# Patient Record
Sex: Male | Born: 1990 | Race: Black or African American | Hispanic: No | Marital: Single | Smoking: Former smoker
Health system: Southern US, Community
[De-identification: ages and names within clinical notes are randomized; demographics above are authoritative.]

## PROBLEM LIST (undated history)

## (undated) DIAGNOSIS — E119 Type 2 diabetes mellitus without complications: Secondary | ICD-10-CM

## (undated) DIAGNOSIS — I10 Essential (primary) hypertension: Secondary | ICD-10-CM

---
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
## 2016-06-23 ENCOUNTER — Emergency Department (HOSPITAL_COMMUNITY): Payer: Managed Care, Other (non HMO)

## 2016-06-23 ENCOUNTER — Emergency Department (HOSPITAL_COMMUNITY)
Admission: EM | Admit: 2016-06-23 | Discharge: 2016-06-23 | Disposition: A | Discharge status: Home / Self Care | Payer: Managed Care, Other (non HMO) | Attending: Emergency Medicine | Admitting: Emergency Medicine

## 2016-06-23 ENCOUNTER — Encounter (HOSPITAL_COMMUNITY): Payer: Self-pay | Admitting: Emergency Medicine

## 2016-06-23 DIAGNOSIS — Z87891 Personal history of nicotine dependence: Secondary | ICD-10-CM | POA: Diagnosis not present

## 2016-06-23 DIAGNOSIS — R51 Headache: Secondary | ICD-10-CM | POA: Diagnosis not present

## 2016-06-23 DIAGNOSIS — R200 Anesthesia of skin: Secondary | ICD-10-CM

## 2016-06-23 DIAGNOSIS — F410 Panic disorder [episodic paroxysmal anxiety] without agoraphobia: Secondary | ICD-10-CM | POA: Insufficient documentation

## 2016-06-23 DIAGNOSIS — R791 Abnormal coagulation profile: Secondary | ICD-10-CM | POA: Diagnosis not present

## 2016-06-23 DIAGNOSIS — R202 Paresthesia of skin: Secondary | ICD-10-CM

## 2016-06-23 DIAGNOSIS — R002 Palpitations: Secondary | ICD-10-CM | POA: Diagnosis present

## 2016-06-23 LAB — CBC
HEMATOCRIT: 40.8 % (ref 39.0–52.0)
Hemoglobin: 13.8 g/dL (ref 13.0–17.0)
MCH: 32.9 pg (ref 26.0–34.0)
MCHC: 33.8 g/dL (ref 30.0–36.0)
MCV: 97.4 fL (ref 78.0–100.0)
PLATELETS: 290 10*3/uL (ref 150–400)
RBC: 4.19 MIL/uL — ABNORMAL LOW (ref 4.22–5.81)
RDW: 12.3 % (ref 11.5–15.5)
WBC: 11.1 10*3/uL — ABNORMAL HIGH (ref 4.0–10.5)

## 2016-06-23 LAB — COMPREHENSIVE METABOLIC PANEL
ALT: 35 U/L (ref 17–63)
AST: 25 U/L (ref 15–41)
Albumin: 4.3 g/dL (ref 3.5–5.0)
Alkaline Phosphatase: 58 U/L (ref 38–126)
Anion gap: 6 (ref 5–15)
BUN: 13 mg/dL (ref 6–20)
CHLORIDE: 106 mmol/L (ref 101–111)
CO2: 27 mmol/L (ref 22–32)
Calcium: 9.4 mg/dL (ref 8.9–10.3)
Creatinine, Ser: 0.82 mg/dL (ref 0.61–1.24)
Glucose, Bld: 101 mg/dL — ABNORMAL HIGH (ref 65–99)
POTASSIUM: 3.4 mmol/L — AB (ref 3.5–5.1)
SODIUM: 139 mmol/L (ref 135–145)
Total Bilirubin: 0.5 mg/dL (ref 0.3–1.2)
Total Protein: 7.8 g/dL (ref 6.5–8.1)

## 2016-06-23 LAB — I-STAT CHEM 8, ED
BUN: 13 mg/dL (ref 6–20)
CALCIUM ION: 1.15 mmol/L (ref 1.15–1.40)
Chloride: 105 mmol/L (ref 101–111)
Creatinine, Ser: 0.8 mg/dL (ref 0.61–1.24)
Glucose, Bld: 96 mg/dL (ref 65–99)
HCT: 42 % (ref 39.0–52.0)
HEMOGLOBIN: 14.3 g/dL (ref 13.0–17.0)
Potassium: 3.4 mmol/L — ABNORMAL LOW (ref 3.5–5.1)
SODIUM: 139 mmol/L (ref 135–145)
TCO2: 29 mmol/L (ref 0–100)

## 2016-06-23 LAB — DIFFERENTIAL
BASOS ABS: 0 10*3/uL (ref 0.0–0.1)
BASOS PCT: 0 %
EOS ABS: 0.1 10*3/uL (ref 0.0–0.7)
Eosinophils Relative: 1 %
Lymphocytes Relative: 22 %
Lymphs Abs: 2.4 10*3/uL (ref 0.7–4.0)
MONO ABS: 1.5 10*3/uL — AB (ref 0.1–1.0)
MONOS PCT: 14 %
Neutro Abs: 7 10*3/uL (ref 1.7–7.7)
Neutrophils Relative %: 63 %

## 2016-06-23 LAB — CBG MONITORING, ED: Glucose-Capillary: 91 mg/dL (ref 65–99)

## 2016-06-23 LAB — I-STAT TROPONIN, ED: TROPONIN I, POC: 0 ng/mL (ref 0.00–0.08)

## 2016-06-23 LAB — APTT: APTT: 35 s (ref 24–36)

## 2016-06-23 LAB — PROTIME-INR
INR: 0.94
PROTHROMBIN TIME: 12.5 s (ref 11.4–15.2)

## 2016-06-23 MED ORDER — THIAMINE HCL 100 MG/ML IJ SOLN
100.0000 mg | Freq: Once | INTRAMUSCULAR | Status: DC
Start: 2016-06-23 — End: 2016-06-23

## 2016-06-23 NOTE — ED Provider Notes (Signed)
[facility] DEPT Provider Note   CSN: [id number] Arrival date & time: 06/23/16  1258     History   Chief Complaint Chief Complaint  Patient presents with  . Dizziness  . Numbness    HPI [facility] Greer is a 26 y.o. male.  He presents for evaluation of a constellation of symptoms including facial tingling, palpitations, headache and neck pain, all of which started while playing a video game today.  Symptoms mostly resolved, with mild residual left face numbness, mild headache, and mild soreness in his neck.  Recalls having a similar episode about 1 week ago which lasted 15 minutes also while relaxing.  He recalls the headache as being present upon awakening this morning he denies prior history of migraine headaches, recent trauma to head, fever, chills, nausea or vomiting.  He came here by private vehicle.  There are no other known modifying factors.  HPI  History reviewed. No pertinent past medical history.  There are no active problems to display for this patient.   History reviewed. No pertinent surgical history.     Home Medications    Prior to Admission medications   Not on File    Family History No family history on file.  Social History Social History  Substance Use Topics  . Smoking status: Former Games developer  . Smokeless tobacco: Never Used  . Alcohol use No     Allergies   Patient has no known allergies.   Review of Systems Review of Systems  All other systems reviewed and are negative.    Physical Exam Updated Vital Signs BP 139/77 (BP Location: Left Arm)   Pulse 93   Temp 98.4 F (36.9 C) (Oral)   Resp (!) 25   Ht  (1.753 m)   Wt 280 lb (127 kg)   SpO2 97%   BMI 41.35 kg/m   Physical Exam  Constitutional: He is oriented to person, place, and time. He appears well-developed.  Overweight.  HENT:  Head: Normocephalic and atraumatic.  Right Ear: External ear normal.  Left Ear: External ear normal.  Eyes: Conjunctivae and EOM are  normal. Pupils are equal, round, and reactive to light.  Neck: Normal range of motion and phonation normal. Neck supple.  No meningismus.  Cardiovascular: Normal rate, regular rhythm and normal heart sounds.   Pulmonary/Chest: Effort normal and breath sounds normal. He exhibits no bony tenderness.  Abdominal: Soft. There is no tenderness.  Musculoskeletal: Normal range of motion.  Neurological: He is alert and oriented to person, place, and time. No sensory deficit. He exhibits normal muscle tone. Coordination normal.  No dysarthria or aphasia or nystagmus.  Very minimal light touch sensation decreased left mid face.  No facial asymmetry.  No ataxia.  No pronator drift.  Skin: Skin is warm, dry and intact.  Psychiatric: He has a normal mood and affect. His behavior is normal. Judgment and thought content normal.  Nursing note and vitals reviewed.    ED Treatments / Results  Labs (all labs ordered are listed, but only abnormal results are displayed) Labs Reviewed  CBC - Abnormal; Notable for the following:       Result Value   WBC 11.1 (*)    RBC 4.19 (*)    All other components within normal limits  DIFFERENTIAL - Abnormal; Notable for the following:    Monocytes Absolute 1.5 (*)    All other components within normal limits  COMPREHENSIVE METABOLIC PANEL - Abnormal; Notable for the following:  Potassium 3.4 (*)    Glucose, Bld 101 (*)    All other components within normal limits  I-STAT CHEM 8, ED - Abnormal; Notable for the following:    Potassium 3.4 (*)    All other components within normal limits  PROTIME-INR  APTT  I-STAT TROPOININ, ED  CBG MONITORING, ED    EKG  EKG Interpretation  Date/Time:  Tuesday June 23 2016 13:17:06 EDT Ventricular Rate:  99 PR Interval:    QRS Duration: 89 QT Interval:  335 QTC Calculation: 430 R Axis:   18 Text Interpretation:  Sinus rhythm Borderline T abnormalities, inferior leads agree. no old comparison Confirmed by Donnald Garre,  MD, Lebron Conners [phone]) on 06/23/2016 1:26:35 PM       Radiology Ct Head Wo Contrast  Result Date: 06/23/2016 CLINICAL DATA:  26 year old male with pressure occipital region. Tingling left face. Lightheadedness. Similar symptoms 2 weeks ago. Initial encounter. EXAM: CT HEAD WITHOUT CONTRAST TECHNIQUE: Contiguous axial images were obtained from the base of the skull through the vertex without intravenous contrast. COMPARISON:  04/26/2009 head CT. FINDINGS: Brain: No intracranial hemorrhage or CT evidence of large acute infarct. No intracranial mass lesion noted on this unenhanced exam. Vascular: No hyperdense vessel. Skull: Negative. Sinuses/Orbits: No acute orbital abnormality. Mild mucosal thickening right maxillary sinus with polypoid opacification left maxillary sinus. Other: Negative IMPRESSION: No acute intracranial abnormality. Mild mucosal thickening right maxillary sinus and polypoid opacification left maxillary sinus. Electronically Signed   By: Lacy Duverney M.D.   On: 06/23/2016 14:23    Procedures Procedures (including critical care time)  Medications Ordered in ED Medications - No data to display   Initial Impression / Assessment and Plan / ED Course  I have reviewed the triage vital signs and the nursing notes.  Pertinent labs & imaging results that were available during my care of the patient were reviewed by me and considered in my medical decision making (see chart for details).     Medications - No data to display  Patient Vitals for the past 24 hrs:  BP Temp Temp src Pulse Resp SpO2 Height Weight  06/23/16 1418 139/77 98.4 F (36.9 C) Oral 93 (!) 25 97 % - -  06/23/16 1308 (!) 149/97 98.7 F (37.1 C) Oral (!) 102 17 97 %  (1.753 m) 280 lb (127 kg)    5:12 PM Reevaluation with update and discussion. After initial assessment and treatment, an updated evaluation reveals he states that the head discomfort and facial tingling have entirely resolved spontaneously.  He  was unable to tolerate MRI, because it was "freaky."  Findings discussed with the patient and all questions answered. Nieshia [facility] Greer    Final Clinical Impressions(s) / ED Diagnoses   Final diagnoses:  Panic attack    Nonspecific symptoms, unlikely to represent stroke.  Patient was unable to tolerate MRI imaging.  Symptoms are most consistent with panic attack, which has occurred twice.  Doubt CVA, TIA, ACS, PE or pneumonia.  Nursing Notes Reviewed/ Care Coordinated Applicable Imaging Reviewed Interpretation of Laboratory Data incorporated into ED treatment  The patient appears reasonably screened and/or stabilized for discharge and I doubt any other medical condition or other [facility] requiring further screening, evaluation, or treatment in the ED at this time prior to discharge.  Plan: Home Medications-OTC, as needed; Home Treatments-rest, fluids; return here if the recommended treatment, does not improve the symptoms; Recommended follow up-PCP and neurology follow-up 1-2 weeks.   New Prescriptions New Prescriptions   No  medications on file     Mancel Bale, MD 06/23/16 1720

## 2016-06-23 NOTE — ED Triage Notes (Signed)
Patient states that he was playing a video game when he got lightheaded, had numbness in left side of face. Patient states that same symptoms happened couple weeks ago but he ignored it.

## 2016-06-23 NOTE — ED Notes (Signed)
Pt returned from MRI °

## 2016-06-23 NOTE — ED Notes (Signed)
Patient was alert, oriented and stable upon discharge. RN went over AVS and patient had no further questions.  

## 2016-06-23 NOTE — Discharge Instructions (Signed)
Get plenty of rest, drink plenty of fluids and try to eat 3 good meals each day.  Follow-up with a primary care doctor for checkup in 1-2 weeks.  Use the phone number attached to help you find a doctor

## 2016-06-23 NOTE — ED Notes (Signed)
Pt transported to MRI 

## 2016-06-23 NOTE — ED Notes (Signed)
MRI informed RN that pt did not tolerate MRI and refused to try again even with sedation meds. MD made aware.

## 2016-06-27 ENCOUNTER — Emergency Department (HOSPITAL_COMMUNITY)
Admission: EM | Admit: 2016-06-27 | Discharge: 2016-06-27 | Disposition: A | Discharge status: Home / Self Care | Payer: Managed Care, Other (non HMO) | Attending: Dermatology | Admitting: Dermatology

## 2016-06-27 ENCOUNTER — Encounter (HOSPITAL_COMMUNITY): Payer: Self-pay

## 2016-06-27 DIAGNOSIS — Z5321 Procedure and treatment not carried out due to patient leaving prior to being seen by health care provider: Secondary | ICD-10-CM | POA: Diagnosis not present

## 2016-06-27 DIAGNOSIS — F419 Anxiety disorder, unspecified: Secondary | ICD-10-CM | POA: Insufficient documentation

## 2016-06-27 NOTE — ED Triage Notes (Signed)
He states he was seen here a few days ago and was dx with anxiety/panic attack. He states he continues to have similar sx "whenever I think about too many things I get short of breath". He is in no distress.

## 2016-06-27 NOTE — ED Notes (Signed)
Bed: WA19 Expected date:  Expected time:  Means of arrival:  Comments: 

## 2016-06-27 NOTE — ED Notes (Signed)
Pt called for triage no response ?

## 2018-04-05 IMAGING — CT CT HEAD W/O CM
3 of 4 series · 15 of 47 positions shown, 18 images · non-contrast
Comparison: 04/26/2009 head CT.

CLINICAL DATA: 25-year-old male with pressure occipital region.
Tingling left face. Lightheadedness. Similar symptoms 2 weeks ago.
Initial encounter.

EXAM:
CT HEAD WITHOUT CONTRAST
TECHNIQUE: Contiguous axial images were obtained from the base of the skull
through the vertex without intravenous contrast.

[Series 2: head w/o · axial · non-contrast · 0.45mm/px · z∈[-147,-17]mm · 9 of 32 slices shown, 12 images]
[im 3/32  brain]
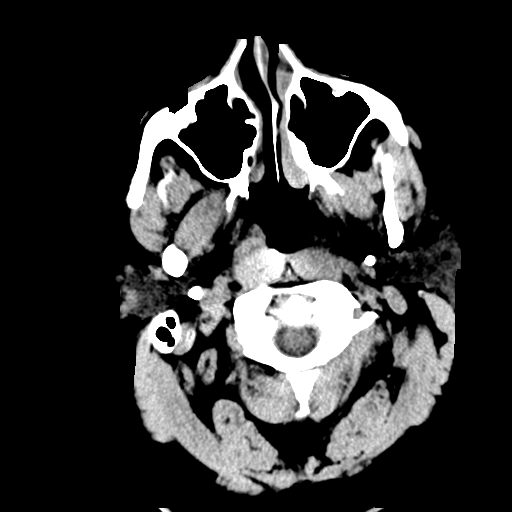
[im 3/32  bone]
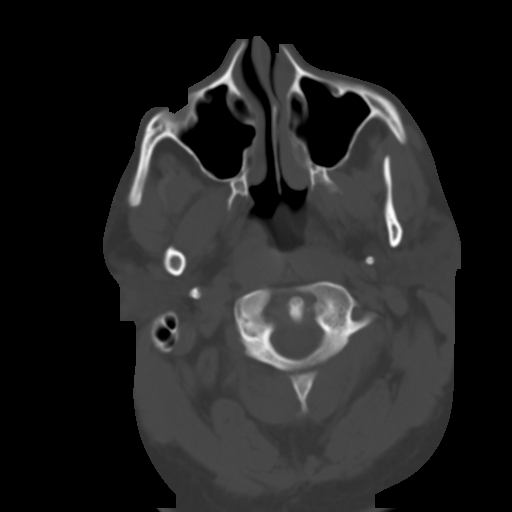
[im 7/32  brain]
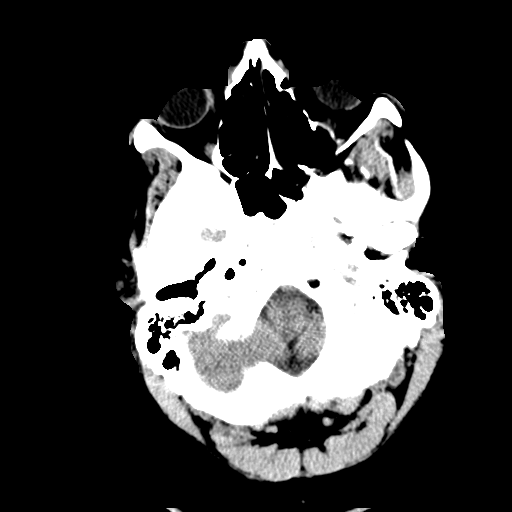
[im 9/32  brain]
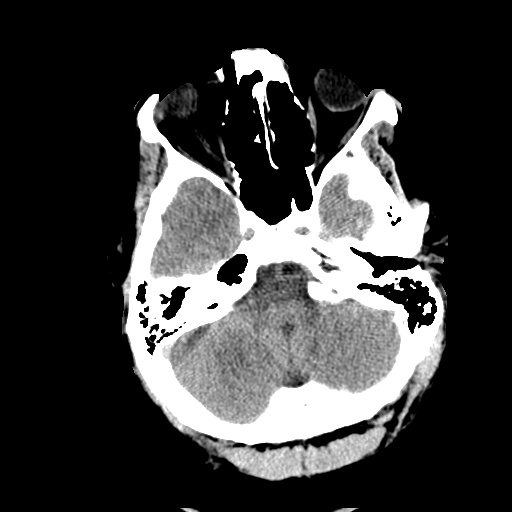
[im 14/32  brain]
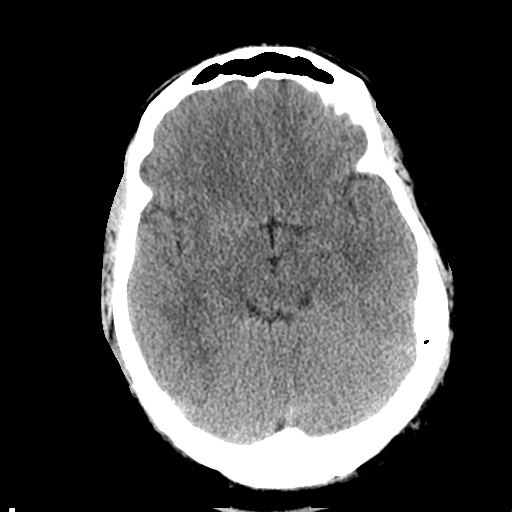
[im 16/32  brain]
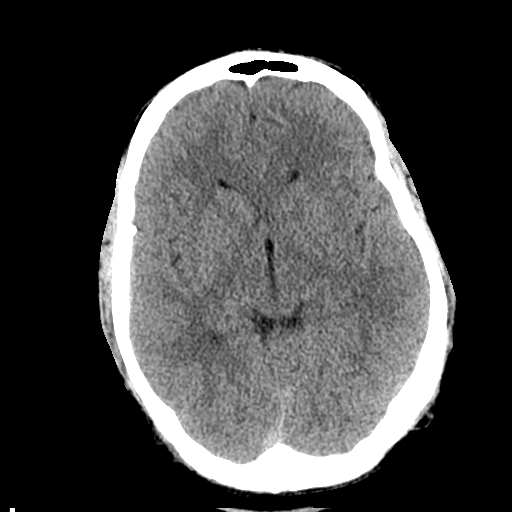
[im 16/32  bone]
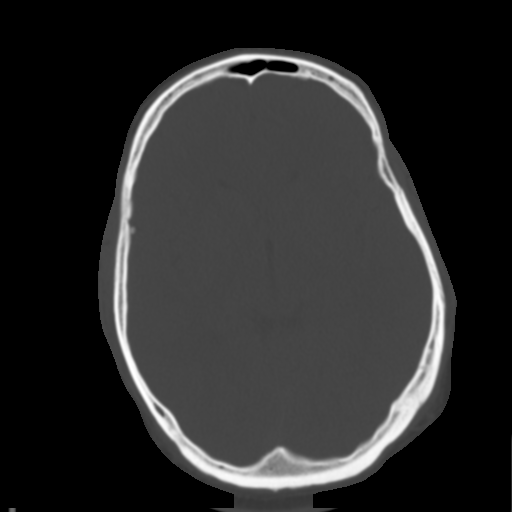
[im 18/32  brain]
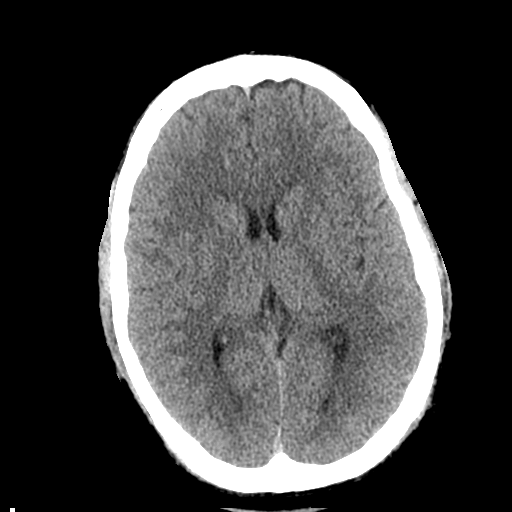
[im 23/32  brain]
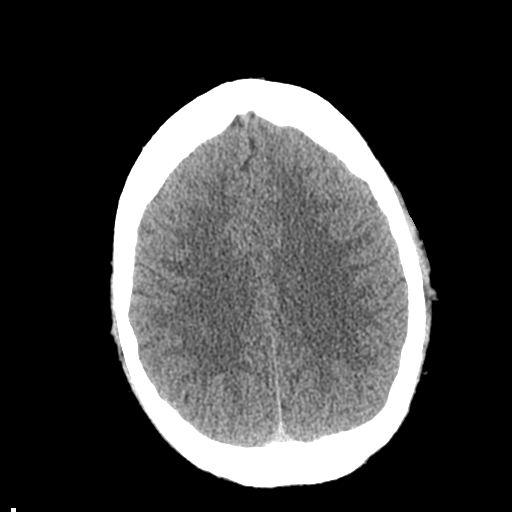
[im 25/32  brain]
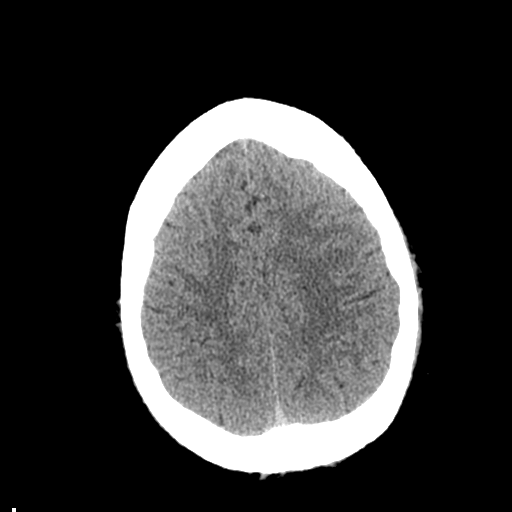
[im 29/32  brain]
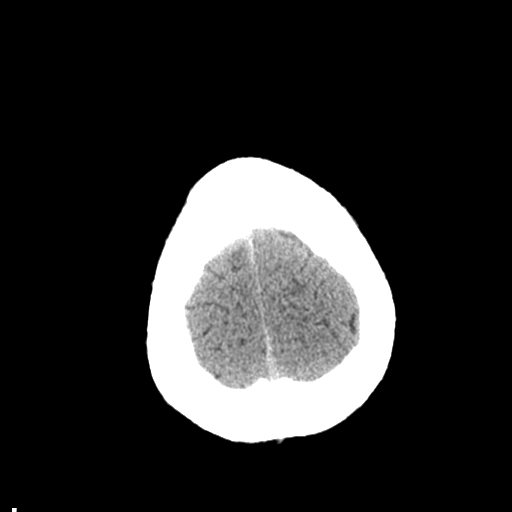
[im 29/32  bone]
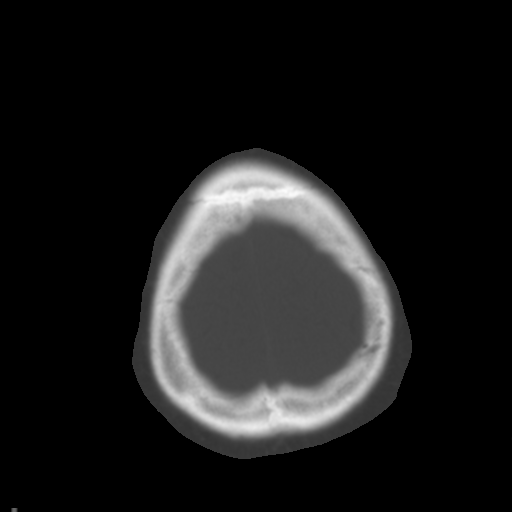

[Series 4: coronal · coronal · 0.36mm/px · 3 of 73 slices shown]
[im 25/73  brain]
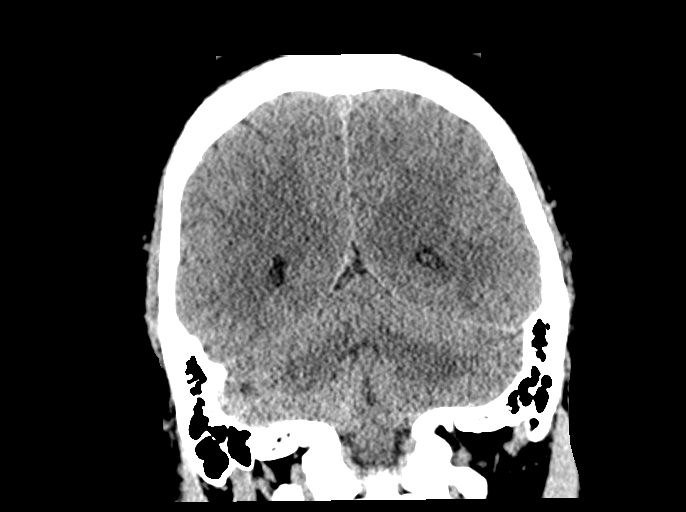
[im 33/73  brain]
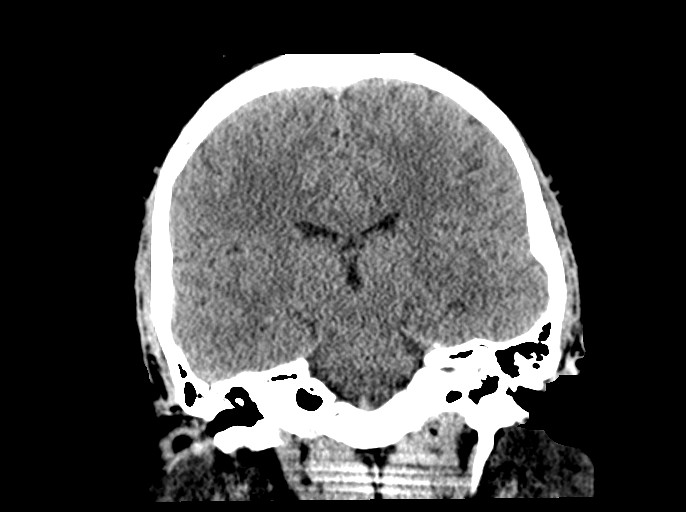
[im 41/73  brain]
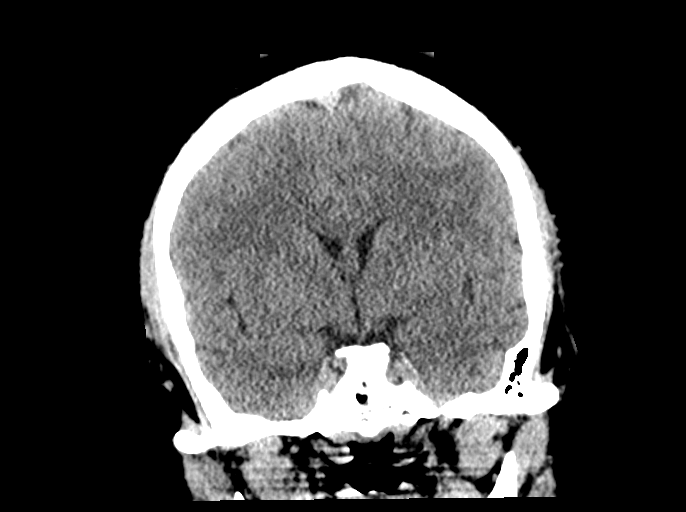

[Series 5: sagittal · sagittal · 0.33mm/px · 3 of 77 slices shown]
[im 26/77  brain]
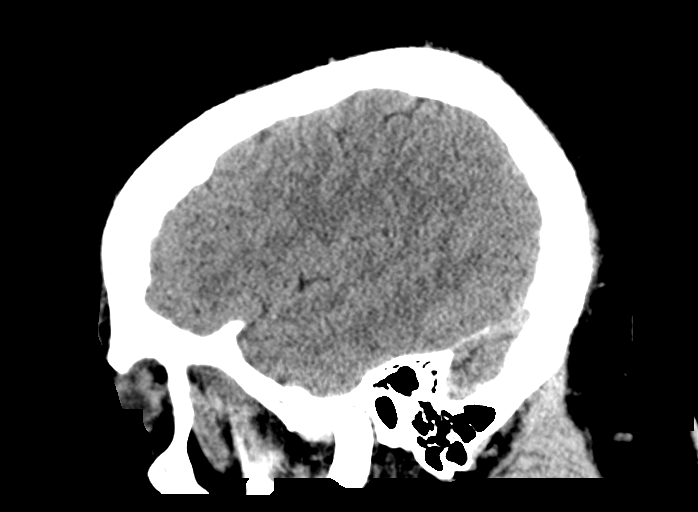
[im 39/77  brain]
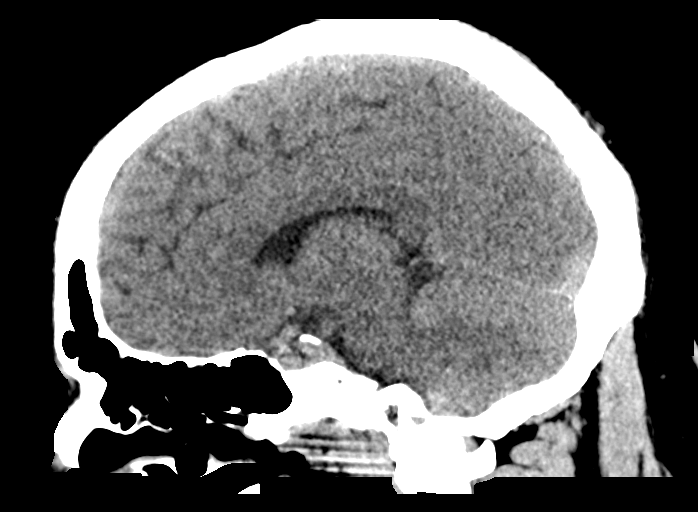
[im 51/77  brain]
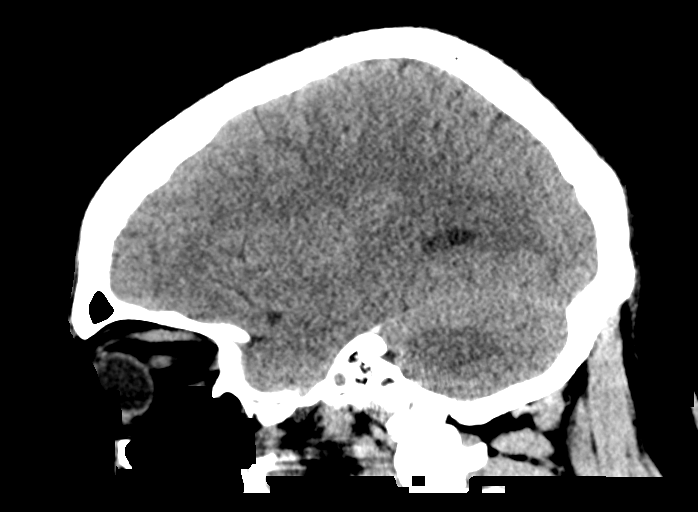

[15 of 47 positions shown; findings below may reference images not displayed]

FINDINGS: Brain: No intracranial hemorrhage or CT evidence of large acute
infarct.

No intracranial mass lesion noted on this unenhanced exam.

Vascular: No hyperdense vessel.

Skull: Negative.

Sinuses/Orbits: No acute orbital abnormality.

Mild mucosal thickening right maxillary sinus with polypoid
opacification left maxillary sinus.

Other: Negative
IMPRESSION: No acute intracranial abnormality.

Mild mucosal thickening right maxillary sinus and polypoid
opacification left maxillary sinus.

## 2022-05-15 ENCOUNTER — Ambulatory Visit (HOSPITAL_COMMUNITY): Payer: Self-pay

## 2022-05-21 ENCOUNTER — Emergency Department (HOSPITAL_COMMUNITY): Payer: Medicaid Other

## 2022-05-21 ENCOUNTER — Ambulatory Visit (HOSPITAL_COMMUNITY)
Admission: RE | Admit: 2022-05-21 | Discharge: 2022-05-21 | Disposition: A | Discharge status: Home / Self Care | Payer: Medicaid Other | Source: Ambulatory Visit | Attending: Internal Medicine | Admitting: Internal Medicine

## 2022-05-21 ENCOUNTER — Encounter (HOSPITAL_COMMUNITY): Payer: Self-pay | Admitting: Emergency Medicine

## 2022-05-21 ENCOUNTER — Encounter (HOSPITAL_COMMUNITY): Payer: Self-pay

## 2022-05-21 ENCOUNTER — Inpatient Hospital Stay (HOSPITAL_COMMUNITY)
Admission: EM | Admit: 2022-05-21 | Discharge: 2022-05-23 | DRG: 871 | Disposition: A | Discharge status: Home / Self Care | Payer: Medicaid Other | Attending: Internal Medicine | Admitting: Internal Medicine

## 2022-05-21 ENCOUNTER — Other Ambulatory Visit: Payer: Self-pay

## 2022-05-21 VITALS — BP 162/90 | HR 125 | Temp 101.1°F | Resp 20

## 2022-05-21 DIAGNOSIS — I10 Essential (primary) hypertension: Secondary | ICD-10-CM | POA: Diagnosis present

## 2022-05-21 DIAGNOSIS — Z8249 Family history of ischemic heart disease and other diseases of the circulatory system: Secondary | ICD-10-CM | POA: Diagnosis not present

## 2022-05-21 DIAGNOSIS — E1165 Type 2 diabetes mellitus with hyperglycemia: Secondary | ICD-10-CM

## 2022-05-21 DIAGNOSIS — Z87891 Personal history of nicotine dependence: Secondary | ICD-10-CM | POA: Diagnosis not present

## 2022-05-21 DIAGNOSIS — Z1152 Encounter for screening for COVID-19: Secondary | ICD-10-CM | POA: Diagnosis not present

## 2022-05-21 DIAGNOSIS — A4189 Other specified sepsis: Secondary | ICD-10-CM | POA: Diagnosis not present

## 2022-05-21 DIAGNOSIS — J9601 Acute respiratory failure with hypoxia: Secondary | ICD-10-CM | POA: Diagnosis not present

## 2022-05-21 DIAGNOSIS — Z833 Family history of diabetes mellitus: Secondary | ICD-10-CM | POA: Diagnosis not present

## 2022-05-21 DIAGNOSIS — J440 Chronic obstructive pulmonary disease with acute lower respiratory infection: Secondary | ICD-10-CM | POA: Diagnosis present

## 2022-05-21 DIAGNOSIS — J1000 Influenza due to other identified influenza virus with unspecified type of pneumonia: Secondary | ICD-10-CM | POA: Diagnosis present

## 2022-05-21 DIAGNOSIS — J189 Pneumonia, unspecified organism: Principal | ICD-10-CM

## 2022-05-21 DIAGNOSIS — E669 Obesity, unspecified: Secondary | ICD-10-CM | POA: Diagnosis present

## 2022-05-21 DIAGNOSIS — Z6830 Body mass index (BMI) 30.0-30.9, adult: Secondary | ICD-10-CM

## 2022-05-21 DIAGNOSIS — E785 Hyperlipidemia, unspecified: Secondary | ICD-10-CM | POA: Diagnosis present

## 2022-05-21 DIAGNOSIS — J020 Streptococcal pharyngitis: Secondary | ICD-10-CM | POA: Insufficient documentation

## 2022-05-21 DIAGNOSIS — E119 Type 2 diabetes mellitus without complications: Secondary | ICD-10-CM

## 2022-05-21 DIAGNOSIS — B009 Herpesviral infection, unspecified: Secondary | ICD-10-CM | POA: Diagnosis present

## 2022-05-21 DIAGNOSIS — E876 Hypokalemia: Secondary | ICD-10-CM | POA: Diagnosis present

## 2022-05-21 HISTORY — DX: Essential (primary) hypertension: I10

## 2022-05-21 LAB — COMPREHENSIVE METABOLIC PANEL
ALT: 49 U/L — ABNORMAL HIGH (ref 0–44)
AST: 38 U/L (ref 15–41)
Albumin: 2.4 g/dL — ABNORMAL LOW (ref 3.5–5.0)
Alkaline Phosphatase: 60 U/L (ref 38–126)
Anion gap: 16 — ABNORMAL HIGH (ref 5–15)
BUN: 13 mg/dL (ref 6–20)
CO2: 23 mmol/L (ref 22–32)
Calcium: 8.8 mg/dL — ABNORMAL LOW (ref 8.9–10.3)
Chloride: 95 mmol/L — ABNORMAL LOW (ref 98–111)
Creatinine, Ser: 1.11 mg/dL (ref 0.61–1.24)
GFR, Estimated: >60 mL/min (ref >60)
Glucose, Bld: 436 mg/dL — ABNORMAL HIGH (ref 70–99)
Potassium: 3.4 mmol/L — ABNORMAL LOW (ref 3.5–5.1)
Sodium: 134 mmol/L — ABNORMAL LOW (ref 135–145)
Total Bilirubin: 1 mg/dL (ref 0.3–1.2)
Total Protein: 7.6 g/dL (ref 6.5–8.1)

## 2022-05-21 LAB — RESP PANEL BY RT-PCR (RSV, FLU A&B, COVID)  RVPGX2
Influenza A by PCR: NEGATIVE
Influenza B by PCR: POSITIVE — AB
Resp Syncytial Virus by PCR: NEGATIVE
SARS Coronavirus 2 by RT PCR: NEGATIVE

## 2022-05-21 LAB — BLOOD GAS, VENOUS
Acid-Base Excess: 4 mmol/L — ABNORMAL HIGH (ref 0.0–2.0)
Bicarbonate: 27.6 mmol/L (ref 20.0–28.0)
O2 Saturation: 96.9 %
Patient temperature: 37
pCO2, Ven: 37 mmHg — ABNORMAL LOW (ref 44–60)
pH, Ven: 7.48 — ABNORMAL HIGH (ref 7.25–7.43)
pO2, Ven: 80 mmHg — ABNORMAL HIGH (ref 32–45)

## 2022-05-21 LAB — CBC WITH DIFFERENTIAL/PLATELET
Abs Immature Granulocytes: 0 10*3/uL (ref 0.00–0.07)
Basophils Absolute: 0 10*3/uL (ref 0.0–0.1)
Basophils Relative: 0 %
Eosinophils Absolute: 0.1 10*3/uL (ref 0.0–0.5)
Eosinophils Relative: 1 %
HCT: 38.4 % — ABNORMAL LOW (ref 39.0–52.0)
Hemoglobin: 12.5 g/dL — ABNORMAL LOW (ref 13.0–17.0)
Lymphocytes Relative: 28 %
Lymphs Abs: 3.2 10*3/uL (ref 0.7–4.0)
MCH: 31.4 pg (ref 26.0–34.0)
MCHC: 32.6 g/dL (ref 30.0–36.0)
MCV: 96.5 fL (ref 80.0–100.0)
Monocytes Absolute: 1.3 10*3/uL — ABNORMAL HIGH (ref 0.1–1.0)
Monocytes Relative: 11 %
Neutro Abs: 6.8 10*3/uL (ref 1.7–7.7)
Neutrophils Relative %: 60 %
Platelets: 344 10*3/uL (ref 150–400)
RBC: 3.98 MIL/uL — ABNORMAL LOW (ref 4.22–5.81)
RDW: 12.2 % (ref 11.5–15.5)
WBC: 11.4 10*3/uL — ABNORMAL HIGH (ref 4.0–10.5)
nRBC: 0 /100 WBC
nRBC: 0.2 % (ref 0.0–0.2)

## 2022-05-21 LAB — POCT RAPID STREP A, ED / UC: Streptococcus, Group A Screen (Direct): POSITIVE — AB

## 2022-05-21 LAB — HIV ANTIBODY (ROUTINE TESTING W REFLEX): HIV Screen 4th Generation wRfx: NONREACTIVE

## 2022-05-21 LAB — GLUCOSE, CAPILLARY
Glucose-Capillary: 344 mg/dL — ABNORMAL HIGH (ref 70–99)
Glucose-Capillary: 256 mg/dL — ABNORMAL HIGH (ref 70–99)
Glucose-Capillary: 307 mg/dL — ABNORMAL HIGH (ref 70–99)

## 2022-05-21 LAB — LACTIC ACID, PLASMA: Lactic Acid, Venous: 1.8 mmol/L (ref 0.5–1.9)

## 2022-05-21 MED ORDER — INSULIN ASPART 100 UNIT/ML IJ SOLN
0.0000 [IU] | Freq: Three times a day (TID) | INTRAMUSCULAR | Status: DC
  Administered 2022-05-21: 15 [IU] via SUBCUTANEOUS
  Administered 2022-05-22: 11 [IU] via SUBCUTANEOUS
  Administered 2022-05-22: 7 [IU] via SUBCUTANEOUS
  Administered 2022-05-22: 11 [IU] via SUBCUTANEOUS
  Administered 2022-05-23: 7 [IU] via SUBCUTANEOUS

## 2022-05-21 MED ORDER — HYDRALAZINE HCL 25 MG PO TABS: 25.0000 mg | ORAL_TABLET | Freq: Four times a day (QID) | ORAL | Status: DC | PRN

## 2022-05-21 MED ORDER — AMLODIPINE BESYLATE 5 MG PO TABS
5.0000 mg | ORAL_TABLET | Freq: Every day | ORAL | Status: DC
  Administered 2022-05-21: 5 mg via ORAL
  Filled 2022-05-21: qty 1

## 2022-05-21 MED ORDER — ALBUTEROL SULFATE (2.5 MG/3ML) 0.083% IN NEBU: 3.0000 mL | INHALATION_SOLUTION | Freq: Four times a day (QID) | RESPIRATORY_TRACT | Status: DC | PRN

## 2022-05-21 MED ORDER — SODIUM CHLORIDE 0.9 % IV SOLN
500.0000 mg | Freq: Once | INTRAVENOUS | Status: AC
  Administered 2022-05-21: 500 mg via INTRAVENOUS
  Filled 2022-05-21: qty 5

## 2022-05-21 MED ORDER — IBUPROFEN 400 MG PO TABS
400.0000 mg | ORAL_TABLET | Freq: Once | ORAL | Status: AC | PRN
  Administered 2022-05-21: 400 mg via ORAL
  Filled 2022-05-21: qty 1

## 2022-05-21 MED ORDER — SODIUM CHLORIDE 0.9 % IV SOLN
1.0000 g | Freq: Once | INTRAVENOUS | Status: AC
  Administered 2022-05-21: 1 g via INTRAVENOUS
  Filled 2022-05-21: qty 10

## 2022-05-21 MED ORDER — ACYCLOVIR 5 % EX OINT
TOPICAL_OINTMENT | CUTANEOUS | Status: DC
  Filled 2022-05-21: qty 15

## 2022-05-21 MED ORDER — ACETAMINOPHEN 325 MG PO TABS
650.0000 mg | ORAL_TABLET | Freq: Once | ORAL | Status: AC
  Administered 2022-05-21: 650 mg via ORAL

## 2022-05-21 MED ORDER — ACETAMINOPHEN 325 MG PO TABS
ORAL_TABLET | ORAL | Status: AC
  Filled 2022-05-21: qty 2

## 2022-05-21 MED ORDER — ENOXAPARIN SODIUM 40 MG/0.4ML IJ SOSY
40.0000 mg | PREFILLED_SYRINGE | INTRAMUSCULAR | Status: DC
  Administered 2022-05-21 – 2022-05-22 (×2): 40 mg via SUBCUTANEOUS
  Filled 2022-05-21 (×2): qty 0.4

## 2022-05-21 MED ORDER — POTASSIUM CHLORIDE CRYS ER 20 MEQ PO TBCR
40.0000 meq | EXTENDED_RELEASE_TABLET | Freq: Once | ORAL | Status: AC
  Administered 2022-05-21: 40 meq via ORAL
  Filled 2022-05-21: qty 2

## 2022-05-21 MED ORDER — SODIUM CHLORIDE 0.9 % IV BOLUS
1000.0000 mL | Freq: Once | INTRAVENOUS | Status: AC
  Administered 2022-05-21: 1000 mL via INTRAVENOUS

## 2022-05-21 MED ORDER — LISINOPRIL 20 MG PO TABS
20.0000 mg | ORAL_TABLET | Freq: Every day | ORAL | Status: DC
  Administered 2022-05-21: 20 mg via ORAL
  Filled 2022-05-21: qty 1

## 2022-05-21 MED ORDER — ACETAMINOPHEN 325 MG PO TABS: 650.0000 mg | ORAL_TABLET | Freq: Once | ORAL | Status: DC | PRN

## 2022-05-21 MED ORDER — BUPIVACAINE HCL 0.5 % IJ SOLN: 50.0000 mL | Freq: Once | INTRAMUSCULAR | Status: DC

## 2022-05-21 MED ORDER — ACETAMINOPHEN 650 MG RE SUPP: 650.0000 mg | Freq: Four times a day (QID) | RECTAL | Status: DC | PRN

## 2022-05-21 MED ORDER — BUDESONIDE 0.25 MG/2ML IN SUSP
0.2500 mg | Freq: Two times a day (BID) | RESPIRATORY_TRACT | Status: DC
  Administered 2022-05-22 – 2022-05-23 (×2): 0.25 mg via RESPIRATORY_TRACT
  Filled 2022-05-21 (×5): qty 2

## 2022-05-21 MED ORDER — INSULIN ASPART 100 UNIT/ML IJ SOLN
0.0000 [IU] | Freq: Every day | INTRAMUSCULAR | Status: DC
  Administered 2022-05-21: 4 [IU] via SUBCUTANEOUS
  Administered 2022-05-22: 2 [IU] via SUBCUTANEOUS

## 2022-05-21 MED ORDER — ACETAMINOPHEN 325 MG PO TABS: 650.0000 mg | ORAL_TABLET | Freq: Four times a day (QID) | ORAL | Status: DC | PRN

## 2022-05-21 MED ORDER — AZITHROMYCIN 250 MG PO TABS
500.0000 mg | ORAL_TABLET | Freq: Every day | ORAL | Status: DC
  Administered 2022-05-22 – 2022-05-23 (×2): 500 mg via ORAL
  Filled 2022-05-21 (×2): qty 2

## 2022-05-21 MED ORDER — IPRATROPIUM-ALBUTEROL 0.5-2.5 (3) MG/3ML IN SOLN
3.0000 mL | Freq: Four times a day (QID) | RESPIRATORY_TRACT | Status: DC
  Administered 2022-05-21 – 2022-05-23 (×6): 3 mL via RESPIRATORY_TRACT
  Filled 2022-05-21 (×8): qty 3

## 2022-05-21 MED ORDER — SODIUM CHLORIDE 0.9 % IV SOLN
2.0000 g | INTRAVENOUS | Status: DC
  Administered 2022-05-22 – 2022-05-23 (×2): 2 g via INTRAVENOUS
  Filled 2022-05-21 (×2): qty 20

## 2022-05-21 MED ORDER — GUAIFENESIN ER 600 MG PO TB12
1200.0000 mg | ORAL_TABLET | Freq: Two times a day (BID) | ORAL | Status: DC
  Administered 2022-05-21 – 2022-05-23 (×5): 1200 mg via ORAL
  Filled 2022-05-21 (×5): qty 2

## 2022-05-21 NOTE — ED Triage Notes (Signed)
Patient here for evaluation of cough, shortness of breath, and body aches that started nine days ago. Seen at urgent care and referred to ED for further evaluation.

## 2022-05-21 NOTE — ED Notes (Signed)
ED TO INPATIENT HANDOFF REPORT  ED Nurse Name and Phone #: [phone]  S Name/Age/Gender [facility] Greer 32 y.o. male Room/Bed: 039C/039C  Code Status   Code Status: Full Code  Home/SNF/Other Home Patient oriented to: self, place, time, and situation Is this baseline? Yes   Triage Complete: Triage complete  Chief Complaint PNA (pneumonia) [J18.9]  Triage Note Patient here for evaluation of cough, shortness of breath, and body aches that started nine days ago. Seen at urgent care and referred to ED for further evaluation.    Allergies No Known Allergies  Level of Care/Admitting Diagnosis ED Disposition     ED Disposition  Admit   Condition  --   Comment  Hospital Area: [facility] [[id number]]  Level of Care: Med-Surg [16]  May place patient in observation at [facility] or [facility] if equivalent level of care is available:: No  Covid Evaluation: Asymptomatic - no recent exposure (last 10 days) testing not required  Diagnosis: PNA (pneumonia) [id number]  Admitting Physician: Lequita Halt [id number]  Attending Physician: Lequita Halt [id number]          B Medical/Surgery History Past Medical History:  Diagnosis Date   Hypertension    No past surgical history on file.   A IV Location/Drains/Wounds Patient Lines/Drains/Airways Status     Active Line/Drains/Airways     Name Placement date Placement time Site Days   Peripheral IV 05/21/22 20 G Left Antecubital 05/21/22  1201  Antecubital  less than 1            Intake/Output Last 24 hours No intake or output data in the 24 hours ending 05/21/22 1436  Labs/Imaging Results for orders placed or performed during the hospital encounter of 05/21/22 (from the past 48 hour(s))  Lactic acid, plasma     Status: None   Collection Time: 05/21/22 11:55 AM  Result Value Ref Range   Lactic Acid, Venous 1.8 0.5 - 1.9 mmol/L    Comment: Performed at [facility], [address],  [place], [place] [address]  Comprehensive metabolic panel     Status: Abnormal   Collection Time: 05/21/22 11:55 AM  Result Value Ref Range   Sodium 134 (L) 135 - 145 mmol/L   Potassium 3.4 (L) 3.5 - 5.1 mmol/L   Chloride 95 (L) 98 - 111 mmol/L   CO2 23 22 - 32 mmol/L   Glucose, Bld 436 (H) 70 - 99 mg/dL    Comment: Glucose reference range applies only to samples taken after fasting for at least 8 hours.   BUN 13 6 - 20 mg/dL   Creatinine, Ser 1.11 0.61 - 1.24 mg/dL   Calcium 8.8 (L) 8.9 - 10.3 mg/dL   Total Protein 7.6 6.5 - 8.1 g/dL   Albumin 2.4 (L) 3.5 - 5.0 g/dL   AST 38 15 - 41 U/L   ALT 49 (H) 0 - 44 U/L   Alkaline Phosphatase 60 38 - 126 U/L   Total Bilirubin 1.0 0.3 - 1.2 mg/dL   GFR, Estimated >60 >60 mL/min    Comment: (NOTE) Calculated using the CKD-EPI Creatinine Equation (2021)    Anion gap 16 (H) 5 - 15    Comment: Performed at [facility], [place] [address], [place], [place] [address]  CBC with Differential     Status: Abnormal   Collection Time: 05/21/22 11:55 AM  Result Value Ref Range   WBC 11.4 (H) 4.0 - 10.5 K/uL  RBC 3.98 (L) 4.22 - 5.81 MIL/uL   Hemoglobin 12.5 (L) 13.0 - 17.0 g/dL   HCT 38.4 (L) 39.0 - 52.0 %   MCV 96.5 80.0 - 100.0 fL   MCH 31.4 26.0 - 34.0 pg   MCHC 32.6 30.0 - 36.0 g/dL   RDW 12.2 11.5 - 15.5 %   Platelets 344 150 - 400 K/uL   nRBC 0.2 0.0 - 0.2 %   Neutrophils Relative % 60 %   Neutro Abs 6.8 1.7 - 7.7 K/uL   Lymphocytes Relative 28 %   Lymphs Abs 3.2 0.7 - 4.0 K/uL   Monocytes Relative 11 %   Monocytes Absolute 1.3 (H) 0.1 - 1.0 K/uL   Eosinophils Relative 1 %   Eosinophils Absolute 0.1 0.0 - 0.5 K/uL   Basophils Relative 0 %   Basophils Absolute 0.0 0.0 - 0.1 K/uL   nRBC 0 0 /100 WBC   Abs Immature Granulocytes 0.00 0.00 - 0.07 K/uL   Polychromasia PRESENT     Comment: Performed at [facility], [address] [place], [place] [address]  Resp panel by RT-PCR (RSV, Flu A&B, Covid) Anterior Nasal Swab     Status:  Abnormal   Collection Time: 05/21/22 12:20 PM   Specimen: Anterior Nasal Swab  Result Value Ref Range   SARS Coronavirus 2 by RT PCR NEGATIVE NEGATIVE   Influenza A by PCR NEGATIVE NEGATIVE   Influenza B by PCR POSITIVE (A) NEGATIVE    Comment: (NOTE) The Xpert Xpress SARS-CoV-2/FLU/RSV plus assay is intended as an aid in the diagnosis of influenza from Nasopharyngeal swab specimens and should not be used as a sole basis for treatment. Nasal washings and aspirates are unacceptable for Xpert Xpress SARS-CoV-2/FLU/RSV testing.  Fact Sheet for Patients: EntrepreneurPulse.com.au  Fact Sheet for Healthcare Providers: IncredibleEmployment.be  This test is not yet approved or cleared by the [place] FDA and has been authorized for detection and/or diagnosis of SARS-CoV-2 by FDA under an Emergency Use Authorization (EUA). This EUA will remain in effect (meaning this test can be used) for the duration of the COVID-19 declaration under Section 564(b)(1) of the Act, 21 U.S.C. section 360bbb-3(b)(1), unless the authorization is terminated or revoked.     Resp Syncytial Virus by PCR NEGATIVE NEGATIVE    Comment: (NOTE) Fact Sheet for Patients: EntrepreneurPulse.com.au  Fact Sheet for Healthcare Providers: IncredibleEmployment.be  This test is not yet approved or cleared by the [place] FDA and has been authorized for detection and/or diagnosis of SARS-CoV-2 by FDA under an Emergency Use Authorization (EUA). This EUA will remain in effect (meaning this test can be used) for the duration of the COVID-19 declaration under Section 564(b)(1) of the Act, 21 U.S.C. section 360bbb-3(b)(1), unless the authorization is terminated or revoked.  Performed at [facility], [place] [address], [place], [place] [address]    DG Chest 2 View  Result Date: 05/21/2022 CLINICAL DATA:  Shortness of breath.  Cough. EXAM:  CHEST - 2 VIEW COMPARISON:  Chest radiographs 04/26/2009 FINDINGS: Cardiac silhouette and mediastinal contours are within normal limits. There mild patchy airspace opacities within left perihilar region and the right infrahilar region. Mild central bronchial wall thickening. No pleural effusion pneumothorax. No acute skeletal abnormality. IMPRESSION: 1. Mild patchy airspace opacities within the left perihilar region and the right infrahilar region, suspicious for pneumonia. 2. Mild central bronchial wall thickening, which can be seen with bronchitis. Electronically Signed   By: Viann Fish.D.  On: 05/21/2022 11:50    Pending Labs Unresulted Labs (From admission, onward)     Start     Ordered   05/22/22 [id number]  Basic metabolic panel  Tomorrow morning,   R        05/21/22 1402   05/22/22 0500  CBC  Tomorrow morning,   R        05/21/22 1402   05/22/22 0500  Proinsulin/insulin ratio  Tomorrow morning,   R        05/21/22 1417   05/21/22 1418  C-peptide  Add-on,   AD        05/21/22 1417   05/21/22 1403  Legionella Pneumophila Serogp 1 Ur Ag  Once,   R        05/21/22 1402   05/21/22 1403  Mycoplasma pneumoniae antibody, IgM  Once,   R        05/21/22 1402   05/21/22 1402  HIV Antibody (routine testing w rflx)  (HIV Antibody (Routine testing w reflex) panel)  Once,   R        05/21/22 1402   05/21/22 1400  Expectorated Sputum Assessment w Gram Stain, Rflx to Resp Cult  Once,   R        05/21/22 1400   05/21/22 1348  Hemoglobin A1c  Once,   URGENT        05/21/22 1347   05/21/22 1335  Blood gas, venous  Once,   R        05/21/22 1334   05/21/22 1334  Urinalysis, Routine w reflex microscopic -Urine, Clean Catch  Once,   URGENT       Question:  Specimen Source  Answer:  Urine, Clean Catch   05/21/22 1334   05/21/22 1235  Culture, blood (routine x 2)  BLOOD CULTURE X 2,   R (with STAT occurrences)     Question:  Patient immune status  Answer:  Normal   05/21/22 1235             Vitals/Pain Today's Vitals   05/21/22 1127 05/21/22 1128 05/21/22 1235  BP:  (!) 193/119 (!) 157/103  Pulse:  (!) 133 (!) 111  Resp:  20 17  Temp:  (!) 101.1 F (38.4 C) 98.8 F (37.1 C)  TempSrc:  Oral Oral  SpO2:  93% 91%  PainSc: 0-No pain      Isolation Precautions No active isolations  Medications Medications  acyclovir ointment (ZOVIRAX) 5 % (has no administration in time range)  cefTRIAXone (ROCEPHIN) 2 g in sodium chloride 0.9 % 100 mL IVPB (has no administration in time range)  ipratropium-albuterol (DUONEB) 0.5-2.5 (3) MG/3ML nebulizer solution 3 mL (has no administration in time range)  budesonide (PULMICORT) nebulizer solution 0.25 mg (has no administration in time range)  albuterol (PROVENTIL) (2.5 MG/3ML) 0.083% nebulizer solution 3 mL (has no administration in time range)  insulin aspart (novoLOG) injection 0-20 Units (has no administration in time range)  insulin aspart (novoLOG) injection 0-5 Units (has no administration in time range)  guaiFENesin (MUCINEX) 12 hr tablet 1,200 mg (1,200 mg Oral Given 05/21/22 1416)  enoxaparin (LOVENOX) injection 40 mg (has no administration in time range)  acetaminophen (TYLENOL) tablet 650 mg (has no administration in time range)    Or  acetaminophen (TYLENOL) suppository 650 mg (has no administration in time range)  azithromycin (ZITHROMAX) tablet 500 mg (has no administration in time range)  amLODipine (NORVASC) tablet 5 mg (has no administration in time range)  lisinopril (ZESTRIL) tablet 20 mg (has no administration in time range)  hydrALAZINE (APRESOLINE) tablet 25 mg (has no administration in time range)  ibuprofen (ADVIL) tablet 400 mg (400 mg Oral Given 05/21/22 1131)  sodium chloride 0.9 % bolus 1,000 mL (1,000 mLs Intravenous New Bag/Given 05/21/22 1256)  cefTRIAXone (ROCEPHIN) 1 g in sodium chloride 0.9 % 100 mL IVPB (0 g Intravenous Stopped 05/21/22 1332)  azithromycin (ZITHROMAX) 500 mg in sodium chloride 0.9 % 250  mL IVPB (500 mg Intravenous New Bag/Given 05/21/22 1301)  sodium chloride 0.9 % bolus 1,000 mL (1,000 mLs Intravenous New Bag/Given 05/21/22 1414)  potassium chloride SA (KLOR-CON M) CR tablet 40 mEq (40 mEq Oral Given 05/21/22 1416)    Mobility walks     Focused Assessments Cardiac Assessment Handoff:    No results found for: "CKTOTAL", "CKMB", "CKMBINDEX", "TROPONINI" No results found for: "DDIMER" Does the Patient currently have chest pain? No   , Renal Assessment Handoff:    R Recommendations: See Admitting Provider Note  Report given to:   Additional Notes: none

## 2022-05-21 NOTE — ED Provider Notes (Signed)
[place]    CSN: [id number] Arrival date & time: 05/21/22  1027      History   Chief Complaint Chief Complaint  Patient presents with   Cough   Appointment    10:30    HPI Walter Greer is a 32 y.o. male comes to the urgent care with worsening shortness of breath, cough productive of yellowish-green sputum and worsening shortness of breath as well as a fever of 101.1 Fahrenheit.  Patient's symptoms started 10 days ago with runny nose, nasal congestion and nonproductive cough.  Patient started feeling worse after a few days.  Cough became productive with copious amounts of yellowish-green sputum.  Patient is complaining of worsening shortness of breath, dizziness and intermittent faintness.  Patient comes in here today because shortness of breath is worsening and he is developed a fever as well as chills.  No chest pain or chest pressure.  He continues to cough.  On arrival in the urgent care patient was hypertensive, tachycardic with pulse oximetry of 89% on room air.  His temperature was 101.1 Fahrenheit.   HPI  History reviewed. No pertinent past medical history.  There are no problems to display for this patient.   History reviewed. No pertinent surgical history.     Home Medications    Prior to Admission medications   Not on File    Family History Family History  Problem Relation Age of Onset   Heart failure Father     Social History Social History   Tobacco Use   Smoking status: Former   Smokeless tobacco: Never  Scientific laboratory technician Use: Never used  Substance Use Topics   Alcohol use: No   Drug use: Never     Allergies   Patient has no known allergies.   Review of Systems Review of Systems As per HPI  Physical Exam Triage Vital Signs ED Triage Vitals  Enc Vitals Group     BP 05/21/22 1047 (!) 162/90     Pulse Rate 05/21/22 1047 (!) 125     Resp 05/21/22 1047 20     Temp 05/21/22 1047 (!) 101.1 F (38.4 C)     Temp Source  05/21/22 1047 Oral     SpO2 05/21/22 1047 (!) 89 %     Weight --      Height --      Head Circumference --      Peak Flow --      Pain Score 05/21/22 1044 4     Pain Loc --      Pain Edu? --      Excl. in [place]? --    No data found.  Updated Vital Signs BP (!) 162/90 (BP Location: Left Arm) Comment (BP Location): large cuff  Pulse (!) 125   Temp (!) 101.1 F (38.4 C) (Oral)   Resp 20   SpO2 (!) 89%   Visual Acuity Right Eye Distance:   Left Eye Distance:   Bilateral Distance:    Right Eye Near:   Left Eye Near:    Bilateral Near:     Physical Exam Vitals and nursing note reviewed.  Constitutional:      Appearance: He is ill-appearing.  Cardiovascular:     Rate and Rhythm: Tachycardia present.     Pulses: Normal pulses.     Heart sounds: Normal heart sounds.  Pulmonary:     Effort: Respiratory distress present.     Breath sounds: Rhonchi and rales present.  Abdominal:     General: Abdomen is flat.  Musculoskeletal:     Cervical back: Normal range of motion.  Neurological:     Mental Status: He is alert.      UC Treatments / Results  Labs (all labs ordered are listed, but only abnormal results are displayed) Labs Reviewed  POCT RAPID STREP A, ED / UC - Abnormal; Notable for the following components:      Result Value   Streptococcus, Group A Screen (Direct) POSITIVE (*)    All other components within normal limits    EKG   Radiology No results found.  Procedures Procedures (including critical care time)  Medications Ordered in UC Medications  acetaminophen (TYLENOL) tablet 650 mg (650 mg Oral Given 05/21/22 1055)    Initial Impression / Assessment and Plan / UC Course  I have reviewed the triage vital signs and the nursing notes.  Pertinent labs & imaging results that were available during my care of the patient were reviewed by me and considered in my medical decision making (see chart for details).     1.  Acute hypoxemic respiratory  failure: Patient is advised to go to the emergency department for further evaluation and management  2.  Community-acquired pneumonia likely strep pneumonia: Patient is advised to go to the emergency department for further evaluation.  He will likely need hospitalization for further management. Final Clinical Impressions(s) / UC Diagnoses   Final diagnoses:  Acute hypoxemic respiratory failure [facility])     Discharge Instructions      Please go to the emergency department for further evaluation and management.   ED Prescriptions   None    PDMP not reviewed this encounter.   Chase Picket, MD 05/21/22 [phone]

## 2022-05-21 NOTE — Progress Notes (Signed)
Flu B positive, given patient's URI symptoms started 7 days ago, out of window for Tamiflu. Will not treat at this time.

## 2022-05-21 NOTE — H&P (Addendum)
History and Physical    Walter Greer [id number] DOB: 11/18/1990 DOA: 05/21/2022  PCP: Practice, [place] (Confirm with patient/family/NH records and if not entered, this has to be entered at [facility] point of entry) Patient coming from: Home  I have personally briefly reviewed patient's old medical records in [place]  Chief Complaint: Fever, cough, shortness of breath  HPI: Walter Greer is a 32 y.o. male with medical history significant of obesity, presented with cough, fever, shortness of breath.  Symptoms started about 1 week ago, patient first developed sore throat, runny nose then congestions, intermittent chills but no fever.  He took some over-the-counter ibuprofen with some help.  Over the next few days, symptoms getting worse started to have productive cough with copious thin light yellow/greenish sputum, with increasing exertional dyspnea, denies any chest pain, no urinary symptoms no diarrhea.  He has a strong family history of hypertension and diabetes but he was never diagnosed with either of them.  ED Course: Running fever 101.1, tachycardia 130s, blood pressure significant elevated 190/110.  Hypoxia O2 saturation 88% room air stabilized on 3 L chest x-ray showed patchy infiltrates on left side of the lungs  Blood work showed glucose of 430, K3.4, creatinine 0.9.  Strep a PCR positive.  Patient was given ceftriaxone and azithromycin in the ED.  Review of Systems: As per HPI otherwise 14 point review of systems negative.    Past Medical History:  Diagnosis Date   Hypertension     No past surgical history on file.   reports that he has quit smoking. He has never used smokeless tobacco. He reports that he does not drink alcohol and does not use drugs.  No Known Allergies  Family History  Problem Relation Age of Onset   Heart failure Father      Prior to Admission medications   Not on File    Physical Exam: Vitals:   05/21/22 1128 05/21/22 1235   BP: (!) 193/119 (!) 157/103  Pulse: (!) 133 (!) 111  Resp: 20 17  Temp: (!) 101.1 F (38.4 C) 98.8 F (37.1 C)  TempSrc: Oral Oral  SpO2: 93% 91%    Constitutional: NAD, calm, comfortable Vitals:   05/21/22 1128 05/21/22 1235  BP: (!) 193/119 (!) 157/103  Pulse: (!) 133 (!) 111  Resp: 20 17  Temp: (!) 101.1 F (38.4 C) 98.8 F (37.1 C)  TempSrc: Oral Oral  SpO2: 93% 91%   Eyes: PERRL, lids and conjunctivae normal ENMT: Mucous membranes are moist. Posterior pharynx clear of any exudate or lesions.Normal dentition.  Neck: normal, supple, no masses, no thyromegaly Respiratory: clear to auscultation bilaterally, scattered wheezing, coarse crackles dominantly on the left side increasing respiratory effort. No accessory muscle use.  Cardiovascular: Regular rate and rhythm, no murmurs / rubs / gallops. No extremity edema. 2+ pedal pulses. No carotid bruits.  Abdomen: no tenderness, no masses palpated. No hepatosplenomegaly. Bowel sounds positive.  Musculoskeletal: no clubbing / cyanosis. No joint deformity upper and lower extremities. Good ROM, no contractures. Normal muscle tone.  Skin: no rashes, lesions, ulcers. No induration Neurologic: CN 2-12 grossly intact. Sensation intact, DTR normal. Strength 5/5 in all 4.  Psychiatric: Normal judgment and insight. Alert and oriented x 3. Normal mood.     Labs on Admission: I have personally reviewed following labs and imaging studies  CBC: Recent Labs  Lab 05/21/22 1155  WBC 11.4*  NEUTROABS 6.8  HGB 12.5*  HCT 38.4*  MCV 96.5  PLT [id number]   Basic Metabolic Panel: Recent Labs  Lab 05/21/22 1155  NA 134*  K 3.4*  CL 95*  CO2 23  GLUCOSE 436*  BUN 13  CREATININE 1.11  CALCIUM 8.8*   GFR: CrCl cannot be calculated (Unknown ideal weight.). Liver Function Tests: Recent Labs  Lab 05/21/22 1155  AST 38  ALT 49*  ALKPHOS 60  BILITOT 1.0  PROT 7.6  ALBUMIN 2.4*   No results for input(s): "LIPASE", "AMYLASE" in the  last 168 hours. No results for input(s): "AMMONIA" in the last 168 hours. Coagulation Profile: No results for input(s): "INR", "PROTIME" in the last 168 hours. Cardiac Enzymes: No results for input(s): "CKTOTAL", "CKMB", "CKMBINDEX", "TROPONINI" in the last 168 hours. BNP (last 3 results) No results for input(s): "PROBNP" in the last 8760 hours. HbA1C: No results for input(s): "HGBA1C" in the last 72 hours. CBG: No results for input(s): "GLUCAP" in the last 168 hours. Lipid Profile: No results for input(s): "CHOL", "HDL", "LDLCALC", "TRIG", "CHOLHDL", "LDLDIRECT" in the last 72 hours. Thyroid Function Tests: No results for input(s): "TSH", "T4TOTAL", "FREET4", "T3FREE", "THYROIDAB" in the last 72 hours. Anemia Panel: No results for input(s): "VITAMINB12", "FOLATE", "FERRITIN", "TIBC", "IRON", "RETICCTPCT" in the last 72 hours. Urine analysis: No results found for: "COLORURINE", "APPEARANCEUR", "LABSPEC", "PHURINE", "GLUCOSEU", "HGBUR", "BILIRUBINUR", "KETONESUR", "PROTEINUR", "UROBILINOGEN", "NITRITE", "LEUKOCYTESUR"  Radiological Exams on Admission: DG Chest 2 View  Result Date: 05/21/2022 CLINICAL DATA:  Shortness of breath.  Cough. EXAM: CHEST - 2 VIEW COMPARISON:  Chest radiographs 04/26/2009 FINDINGS: Cardiac silhouette and mediastinal contours are within normal limits. There mild patchy airspace opacities within left perihilar region and the right infrahilar region. Mild central bronchial wall thickening. No pleural effusion pneumothorax. No acute skeletal abnormality. IMPRESSION: 1. Mild patchy airspace opacities within the left perihilar region and the right infrahilar region, suspicious for pneumonia. 2. Mild central bronchial wall thickening, which can be seen with bronchitis. Electronically Signed   By: Yvonne Kendall M.D.   On: 05/21/2022 11:50    EKG: Independently reviewed.  Sinus tachycardia, no acute ST changes.  Assessment/Plan Principal Problem:   PNA (pneumonia) Active  Problems:   CAP (community acquired pneumonia)   Strep pharyngitis   Diabetes ([place])   Essential hypertension  (please populate well all problems here in Problem List. (For example, if patient is on BP meds at home and you resume or decide to hold them, it is a problem that needs to be her. Same for CAD, COPD, HLD and so on)  Sepsis -Evidenced by new onset of fever, tachycardia, with source of infection of new onset of pneumonia -Received total of 2 L of IV bolus, now appears to be euvolemic, blood pressure stable, will not start maintenance IV fluid. -Continue ceftriaxone to cover CAP probably related to strep a infection, also check atypical study and sputum culture given the feature of pneumonia appears to be multifocal. -Azithromycin -There are also symptoms signs of acute bronchitis, will treat with pulmicort -DuoNebs, Robitussin and incentive symmetric and flutter valve  Acute hypoxic respiratory failure -Secondary to pneumonia, management as above.  New onset diabetes -Sliding scale for now -Check C-peptide, proinsulin/insulin ratio -Expect discharge on p.o. diabetic medications and outpatient follow-up with PCP/endocrinology  Hypokalemia -PO replacement -Re-check level tomorrow.  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

## 2022-05-21 NOTE — Discharge Instructions (Addendum)
Please go to the emergency department for further evaluation and management.

## 2022-05-21 NOTE — ED Notes (Signed)
Sores, blisters in nostrils and around lower lib

## 2022-05-21 NOTE — ED Notes (Signed)
Placed pt on 4L 02 per [place]. Sa02 is now 96%

## 2022-05-21 NOTE — ED Notes (Signed)
Patient is being discharged from the Urgent Care and sent to the Emergency Department via POV . Per Dr Lanny Cramp, patient is in need of higher level of care due to hypoxia. Patient is aware and verbalizes understanding of plan of care.  Vitals:   05/21/22 1047  BP: (!) 162/90  Pulse: (!) 125  Resp: 20  Temp: (!) 101.1 F (38.4 C)  SpO2: (!) 89%

## 2022-05-21 NOTE — ED Provider Notes (Signed)
[facility] Provider Note   CSN: [id number] Arrival date & time: 05/21/22  1122     History  Chief Complaint  Patient presents with   Cough    Walter Greer is a 32 y.o. male.  Walter Greer is a 32 y.o. male with no known medical history, presents to the ED from urgent care for evaluation of worsening shortness of breath and productive cough.  Patient reports symptoms started 10 days ago with runny nose, nasal congestion and nonproductive cough after few days he started to feel worse and started coughing up large amounts of yellow-green sputum.  Patient started experiencing fevers yesterday and feeling increasingly short of breath.  Denies chest pain.  Temp of 101.1 at urgent care.  Patient was also noted to be hypoxic with pulse ox of 89% on room air at urgent care was placed on oxygen and transferred to the ED for further care.  He reports that he was around people with some viral illnesses but all of their symptoms has resolved and he has continued to get worse.  Patient reports he has not seen a regular doctor and has strong family history of high blood pressure and diabetes but has not been diagnosed with that himself.  The history is provided by the patient and medical records.  Cough Associated symptoms: chills, fever, rhinorrhea and shortness of breath   Associated symptoms: no chest pain and no myalgias        Home Medications Prior to Admission medications   Not on File      Allergies    Patient has no known allergies.    Review of Systems   Review of Systems  Constitutional:  Positive for chills and fever.  HENT:  Positive for congestion and rhinorrhea.   Respiratory:  Positive for cough and shortness of breath.   Cardiovascular:  Negative for chest pain and leg swelling.  Gastrointestinal:  Negative for abdominal pain, nausea and vomiting.  Musculoskeletal:  Negative for myalgias.  All other systems reviewed and are  negative.   Physical Exam Updated Vital Signs BP (!) 157/103   Pulse (!) 111   Temp 98.8 F (37.1 C) (Oral)   Resp 17   SpO2 91%  Physical Exam Vitals and nursing note reviewed.  Constitutional:      General: He is not in acute distress.    Appearance: Normal appearance. He is well-developed. He is obese. He is not diaphoretic.     Comments: Patient is alert, on nasal cannula the patient is in no acute distress  HENT:     Head: Normocephalic and atraumatic.     Nose: Congestion present.     Mouth/Throat:     Mouth: Mucous membranes are moist.     Pharynx: Oropharynx is clear.  Eyes:     General:        Right eye: No discharge.        Left eye: No discharge.     Pupils: Pupils are equal, round, and reactive to light.  Cardiovascular:     Rate and Rhythm: Regular rhythm. Tachycardia present.     Pulses: Normal pulses.     Heart sounds: Normal heart sounds.  Pulmonary:     Effort: Pulmonary effort is normal. No respiratory distress.     Breath sounds: Rhonchi and rales present. No wheezing.     Comments: On 3 L nasal cannula respirations are equal and unlabored and patient is able  to speak in full sentences.  On auscultation he has some scattered rales with rhonchi bilaterally. Abdominal:     General: Bowel sounds are normal. There is no distension.     Palpations: Abdomen is soft. There is no mass.     Tenderness: There is no abdominal tenderness. There is no guarding.     Comments: Abdomen soft, nondistended, nontender to palpation in all quadrants without guarding or peritoneal signs  Musculoskeletal:        General: No deformity.     Cervical back: Neck supple.  Skin:    General: Skin is warm and dry.     Capillary Refill: Capillary refill takes less than 2 seconds.  Neurological:     Mental Status: He is alert and oriented to person, place, and time.     Coordination: Coordination normal.     Comments: Speech is clear, able to follow commands CN III-XII  intact Normal strength in upper and lower extremities bilaterally including dorsiflexion and plantar flexion, strong and equal grip strength Sensation normal to light and sharp touch Moves extremities without ataxia, coordination intact  Psychiatric:        Mood and Affect: Mood normal.        Behavior: Behavior normal.     ED Results / Procedures / Treatments   Labs (all labs ordered are listed, but only abnormal results are displayed) Labs Reviewed  CBC WITH DIFFERENTIAL/PLATELET - Abnormal; Notable for the following components:      Result Value   WBC 11.4 (*)    RBC 3.98 (*)    Hemoglobin 12.5 (*)    HCT 38.4 (*)    All other components within normal limits  RESP PANEL BY RT-PCR (RSV, FLU A&B, COVID)  [id number]  CULTURE, BLOOD (ROUTINE X 2)  CULTURE, BLOOD (ROUTINE X 2)  LACTIC ACID, PLASMA  LACTIC ACID, PLASMA  COMPREHENSIVE METABOLIC PANEL    EKG None  Radiology DG Chest 2 View  Result Date: 05/21/2022 CLINICAL DATA:  Shortness of breath.  Cough. EXAM: CHEST - 2 VIEW COMPARISON:  Chest radiographs 04/26/2009 FINDINGS: Cardiac silhouette and mediastinal contours are within normal limits. There mild patchy airspace opacities within left perihilar region and the right infrahilar region. Mild central bronchial wall thickening. No pleural effusion pneumothorax. No acute skeletal abnormality. IMPRESSION: 1. Mild patchy airspace opacities within the left perihilar region and the right infrahilar region, suspicious for pneumonia. 2. Mild central bronchial wall thickening, which can be seen with bronchitis. Electronically Signed   By: Yvonne Kendall M.D.   On: 05/21/2022 11:50    Procedures Procedures    Medications Ordered in ED Medications  sodium chloride 0.9 % bolus 1,000 mL (has no administration in time range)  cefTRIAXone (ROCEPHIN) 1 g in sodium chloride 0.9 % 100 mL IVPB (has no administration in time range)  azithromycin (ZITHROMAX) 500 mg in sodium chloride 0.9 % 250  mL IVPB (has no administration in time range)  ibuprofen (ADVIL) tablet 400 mg (400 mg Oral Given 05/21/22 1131)    ED Course/ Medical Decision Making/ A&P                             Medical Decision Making Amount and/or Complexity of Data Reviewed Labs: ordered. Radiology: ordered.  Risk Prescription drug management. Decision regarding hospitalization.   32 y.o. male presents to the ED with complaints of cough and shortness of breath, initially seen in urgent care and  found to be febrile and hypoxic, this involves an extensive number of treatment options, and is a complaint that carries with it a high risk of complications and morbidity.  The differential diagnosis includes pneumonia, COVID, flu, PE, other viral upper respiratory illness.  On arrival pt is mildly ill-appearing, vitals significant for fever of 101.1, mild tachycardia and hypoxia into the 80s on room air, improved on 3 L nasal cannula.  Additional history obtained from urgent care records. Previous records obtained and reviewed   I ordered medication including ibuprofen for fever as well as Rocephin and azithromycin for pneumonia, IV fluid bolus given.  Lab Tests:  I Ordered, reviewed, and interpreted labs, which included: Mild leukocytosis of 11.4, CMP returned with glucose of 436, anion gap is minimally elevated at 16, patient with normal CO2 23 so low suspicion for DKA but patient does seem to have newly diagnosed diabetes.  Lactic acid is within normal limits, blood cultures pending.  Respiratory viral panel pending as well.  Imaging Studies ordered:  I ordered imaging studies which included chest x-ray m, I independently visualized and interpreted imaging which showed ild patchy airspace opacities in the left perihilar region and right infrahilar region concerning for pneumonia  ED Course:   Given pneumonia on chest x-ray with new oxygen requirement patient will require admission for acute respiratory failure in  the setting of pneumonia.  Patient also with newly diagnosed diabetes noted to be hyperglycemic.  Case discussed with Dr. Roosevelt Locks with [facility] who will see and admit the patient.    Portions of this note were generated with Lobbyist. Dictation errors may occur despite best attempts at proofreading.         Final Clinical Impression(s) / ED Diagnoses Final diagnoses:  Community acquired pneumonia, bilateral  New onset type 2 diabetes mellitus [facility])    Rx / DC Orders ED Discharge Orders     None         Janet Berlin 06/04/22 1604    Carmin Muskrat, MD 06/05/22 [phone]

## 2022-05-21 NOTE — ED Triage Notes (Signed)
Cough for 8-9 days.  Initially thought to be "just a cold".  Patient has had body aches, phlegm is yellow/green, and reports streaks of blood.  Patient complains of sore throat, but thought related to coughing at this time.  Reports no fever in the past 2 days.  Has taken nyquil, dayguil motrin.    Also complains of bilateral ear stuffiness

## 2022-05-22 DIAGNOSIS — J189 Pneumonia, unspecified organism: Secondary | ICD-10-CM | POA: Diagnosis not present

## 2022-05-22 LAB — CBC
HCT: 34.7 % — ABNORMAL LOW (ref 39.0–52.0)
Hemoglobin: 11.2 g/dL — ABNORMAL LOW (ref 13.0–17.0)
MCH: 31.5 pg (ref 26.0–34.0)
MCHC: 32.3 g/dL (ref 30.0–36.0)
MCV: 97.5 fL (ref 80.0–100.0)
Platelets: 342 10*3/uL (ref 150–400)
RBC: 3.56 MIL/uL — ABNORMAL LOW (ref 4.22–5.81)
RDW: 12.4 % (ref 11.5–15.5)
WBC: 13.1 10*3/uL — ABNORMAL HIGH (ref 4.0–10.5)
nRBC: 0 % (ref 0.0–0.2)

## 2022-05-22 LAB — GLUCOSE, CAPILLARY
Glucose-Capillary: 278 mg/dL — ABNORMAL HIGH (ref 70–99)
Glucose-Capillary: 272 mg/dL — ABNORMAL HIGH (ref 70–99)
Glucose-Capillary: 239 mg/dL — ABNORMAL HIGH (ref 70–99)
Glucose-Capillary: 223 mg/dL — ABNORMAL HIGH (ref 70–99)

## 2022-05-22 LAB — HEMOGLOBIN A1C
Hgb A1c MFr Bld: 11.1 % — ABNORMAL HIGH (ref 4.8–5.6)
Mean Plasma Glucose: 272 mg/dL

## 2022-05-22 LAB — EXPECTORATED SPUTUM ASSESSMENT W GRAM STAIN, RFLX TO RESP C

## 2022-05-22 LAB — BASIC METABOLIC PANEL
Anion gap: 14 (ref 5–15)
BUN: 8 mg/dL (ref 6–20)
CO2: 24 mmol/L (ref 22–32)
Calcium: 8.4 mg/dL — ABNORMAL LOW (ref 8.9–10.3)
Chloride: 98 mmol/L (ref 98–111)
Creatinine, Ser: 0.8 mg/dL (ref 0.61–1.24)
GFR, Estimated: >60 mL/min (ref >60)
Glucose, Bld: 304 mg/dL — ABNORMAL HIGH (ref 70–99)
Potassium: 3.3 mmol/L — ABNORMAL LOW (ref 3.5–5.1)
Sodium: 136 mmol/L (ref 135–145)

## 2022-05-22 LAB — MYCOPLASMA PNEUMONIAE ANTIBODY, IGM: Mycoplasma pneumo IgM: <770 U/mL (ref 0–769)

## 2022-05-22 MED ORDER — LIVING WELL WITH DIABETES BOOK
Freq: Once | Status: AC
  Filled 2022-05-22: qty 1

## 2022-05-22 MED ORDER — POTASSIUM CHLORIDE CRYS ER 20 MEQ PO TBCR
40.0000 meq | EXTENDED_RELEASE_TABLET | Freq: Once | ORAL | Status: AC
  Administered 2022-05-22: 40 meq via ORAL
  Filled 2022-05-22: qty 2

## 2022-05-22 MED ORDER — INSULIN GLARGINE-YFGN 100 UNIT/ML ~~LOC~~ SOLN
20.0000 [IU] | Freq: Every day | SUBCUTANEOUS | Status: DC
  Administered 2022-05-22: 20 [IU] via SUBCUTANEOUS
  Filled 2022-05-22 (×2): qty 0.2

## 2022-05-22 MED ORDER — IPRATROPIUM-ALBUTEROL 0.5-2.5 (3) MG/3ML IN SOLN
RESPIRATORY_TRACT | Status: AC
  Filled 2022-05-22: qty 3

## 2022-05-22 MED ORDER — LOSARTAN POTASSIUM 50 MG PO TABS
100.0000 mg | ORAL_TABLET | Freq: Every day | ORAL | Status: DC
  Administered 2022-05-22 – 2022-05-23 (×2): 100 mg via ORAL
  Filled 2022-05-22 (×2): qty 2

## 2022-05-22 MED ORDER — BUDESONIDE 0.5 MG/2ML IN SUSP
RESPIRATORY_TRACT | Status: AC
  Administered 2022-05-22: 0.5 mg
  Filled 2022-05-22: qty 2

## 2022-05-22 MED ORDER — INSULIN STARTER KIT- PEN NEEDLES (ENGLISH)
1.0000 | Freq: Once | Status: AC
  Administered 2022-05-22: 1
  Filled 2022-05-22: qty 1

## 2022-05-22 NOTE — Progress Notes (Signed)
   05/22/22 1500  Mobility  Activity Ambulated independently in hallway  Level of Assistance Independent  Assistive Device None  Distance Ambulated (ft) 550 ft  Activity Response Tolerated well  Mobility Referral Yes  $Mobility charge 1 Mobility   Mobility Specialist Progress Note  Pt was in bed and agreeable. Had no c/o pain. Returned to bed w/ all needs met and call bell in reach.   Lucious Groves Mobility Specialist  Please contact via SecureChat or Rehab office at [phone]

## 2022-05-22 NOTE — TOC CM/SW Note (Cosign Needed)
  Transition of Care (TOC) Screening Note   Patient Details  Name: Walter Greer Date of Birth: 12-06-1990   If home oxygen needed will need oxygen saturation ambulation note and order    Transition of Care Department [facility]) has reviewed patient and no TOC needs have been identified at this time. We will continue to monitor patient advancement through interdisciplinary progression rounds. If new patient transition needs arise, please place a TOC consult.

## 2022-05-22 NOTE — Progress Notes (Signed)
PROGRESS NOTE  Walter Greer  DOB: Aug 02, 1990  PCP: [facility] [id number]  DOA: 05/21/2022  LOS: 1 day  Hospital Day: 2  Brief narrative: Walter Greer is a 32 y.o. male with PMH significant for morbid obesity, HTN 3/7, patient presented to the ED from home with complaint of fever, cough, shortness of breath, runny nose and intermittent fever.  He tried with over-the-counter ibuprofen but symptoms progressed and hence came to the ED  In the ED, patient had a fever of 101.1, tachycardia to 130, blood pressure elevated to 190/110. O2 sat 88% on room air and required 3 L oxygen by nasal cannula. Chest x-ray patchy infiltrates on both perihilar region suspicious for pneumonia. Labs showed WC count elevated to 11.4, hemoglobin 12.5, lactic acid 1.8, sodium 134, potassium 3.4, glucose elevated to 436, anion gap elevated to 16. Respiratory virus panel positive for Influenza B Blood culture sent. Patient was given IV ceftriaxone and azithromycin  Admitted to [facility]   Subjective: Patient was seen and examined this morning.  Propped up in bed.  Not in distress.  On 3 L oxygen.  Friend at bedside. Chart reviewed.  No recurrence of fever overnight.  Heart rate in 90s and 100s. Blood pressure 150s this morning Requiring 3 L oxygen by nasal cannula.  Assessment and plan: Sepsis secondary to pneumonia Acute respiratory failure with hypoxia Flu B+ Presented with fever, tachycardia, Chest x-ray with pneumonia Currently on IV Rocephin and azithromycin. Also positive for flu B but with symptoms ongoing for 7 days, no role of antiviral at this time. Continue Pulmicort, bronchodilators, Robitussin, incentive spirometry, flutter valve. Currently 3 L oxygen by nasal cannula.  Wean down as tolerated.  Check ambulatory oxygen requirement. Recent Labs  Lab 05/21/22 1155 05/22/22 0345  WBC 11.4* 13.1*  LATICACIDVEN 1.8  --     New onset type 2 diabetes mellitus uncontrolled with  hyperglycemia Presented with significant elevated blood glucose level.  Newly diagnosed diabetes with A1c 11.1 on 05/21/2022. He has been started on Semglee 20 units daily with sliding scale insulin.  Continue to monitor. Diabetes care coordinator consulted. Recent Labs  Lab 05/21/22 1615 05/21/22 2015 05/21/22 2234 05/22/22 0751 05/22/22 1204  GLUCAP 344* 256* 307* 278* 272*   Hypokalemia Potassium low at 3.3 this morning.  Patient given. Recent Labs  Lab 05/21/22 1155 05/22/22 0345  K 3.4* 3.3*   Essential hypertension Blood pressure elevated over 190s on presentation.  Not on blood pressure medicines at home.  Given concomitant diabetes, start losartan 100 mg daily today. Continue to monitor blood.  As needed hydralazine. Obtain lipid panel.  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 Patient has been advised to make an attempt to improve diet and exercise patterns to aid in weight loss.    Mobility: Encourage ambulation  Goals of care   Code Status: Full Code     DVT prophylaxis:  enoxaparin (LOVENOX) injection 40 mg Start: 05/21/22 2200   Antimicrobials: IV Rocephin, azithromycin, acyclovir ointment Fluid: None Consultants: None Family Communication: Friend at bedside.  Status is: Inpatient Level of care: Med-Surg   Dispo: Patient is from: Home              Anticipated d/c is to: Hopefully home in 1 to 2 days Continue in-hospital care because: On IV antibiotics, oxygen supplementation.  Ongoing management of newly diagnosed  diabetes   Scheduled Meds:  acyclovir ointment   Topical Q3H   azithromycin  500 mg Oral Daily   budesonide (PULMICORT) nebulizer solution  0.25 mg Nebulization BID   enoxaparin (LOVENOX) injection  40 mg Subcutaneous Q24H   guaiFENesin  1,200 mg Oral BID   insulin aspart  0-20 Units Subcutaneous TID WC   insulin aspart  0-5  Units Subcutaneous QHS   insulin glargine-yfgn  20 Units Subcutaneous Daily   ipratropium-albuterol  3 mL Nebulization Q6H   losartan  100 mg Oral Daily    PRN meds: acetaminophen **OR** acetaminophen, albuterol, hydrALAZINE   Infusions:   cefTRIAXone (ROCEPHIN)  IV 2 g (05/22/22 1005)    Diet:  Diet Order             Diet heart healthy/carb modified Room service appropriate? Yes; Fluid consistency: Thin  Diet effective now                   Antimicrobials: Anti-infectives (From admission, onward)    Start     Dose/Rate Route Frequency Ordered Stop   05/22/22 1000  cefTRIAXone (ROCEPHIN) 2 g in sodium chloride 0.9 % 100 mL IVPB        2 g 200 mL/hr over 30 Minutes Intravenous Every 24 hours 05/21/22 1358     05/22/22 1000  azithromycin (ZITHROMAX) tablet 500 mg        500 mg Oral Daily 05/21/22 1402     05/21/22 1230  cefTRIAXone (ROCEPHIN) 1 g in sodium chloride 0.9 % 100 mL IVPB        1 g 200 mL/hr over 30 Minutes Intravenous  Once 05/21/22 1225 05/21/22 1332   05/21/22 1230  azithromycin (ZITHROMAX) 500 mg in sodium chloride 0.9 % 250 mL IVPB        500 mg 250 mL/hr over 60 Minutes Intravenous  Once 05/21/22 1225 05/21/22 1438       Skin assessment:      Nutritional status:  There is no height or weight on file to calculate BMI.          Objective: Vitals:   05/22/22 0754 05/22/22 0914  BP: (!) 151/103   Pulse: 97   Resp: 18   Temp: 99.5 F (37.5 C)   SpO2: 96% 98%    Intake/Output Summary (Last 24 hours) at 05/22/2022 1426 Last data filed at 05/22/2022 1015 Gross per 24 hour  Intake 2103.32 ml  Output 350 ml  Net 1753.32 ml   There were no vitals filed for this visit. Weight change:  There is no height or weight on file to calculate BMI.   Physical Exam: General exam: Pleasant, young obese African-American male Skin: No rashes, lesions or ulcers. HEENT: Atraumatic, normocephalic, no obvious bleeding Lungs: No crackles or wheezing,  mild cough on deep breathing. CVS: Regular rate and rhythm, no murmur GI/Abd soft, nontender, non distended, bowel sound present CNS: Alert, awake, oriented x 3 Psychiatry: Mood appropriate Extremities: No pedal edema, no calf tenderness  Data Review: I have personally reviewed the laboratory data and studies available.  F/u labs ordered Unresulted Labs (From admission, onward)     Start     Ordered   05/23/22 0500  Lipid panel  Tomorrow morning,   R        05/22/22 1426   05/22/22 0500  Proinsulin/insulin ratio  Tomorrow morning,   R        05/21/22 1417   05/21/22 1418  C-peptide  Add-on,   AD        05/21/22 1417   05/21/22 1403  Legionella Pneumophila Serogp 1 Ur Ag  Once,   R        05/21/22 1402   05/21/22 1403  Mycoplasma pneumoniae antibody, IgM  Once,   R        05/21/22 1402   05/21/22 1334  Urinalysis, Routine w reflex microscopic -Urine, Clean Catch  Once,   URGENT       Question:  Specimen Source  Answer:  Urine, Clean Catch   05/21/22 1334            Total time spent in review of labs and imaging, patient evaluation, formulation of plan, documentation and communication with family: 50 minutes  Signed, Terrilee Croak, MD [facility] 05/22/2022

## 2022-05-22 NOTE — Progress Notes (Signed)
Nurse requested Mobility Specialist to perform oxygen saturation test with pt which includes removing pt from oxygen both at rest and while ambulating.  Below are the results from that testing.     Patient Saturations on Room Air at Rest = spO2 96%  Patient Saturations on Room Air while Ambulating = sp02 88% .   Patient Saturations on 0 Liters of oxygen while Ambulating = sp02 88%  At end of testing pt left in room on 0  Liters of oxygen.  Reported results to nurse.   

## 2022-05-23 DIAGNOSIS — J189 Pneumonia, unspecified organism: Secondary | ICD-10-CM | POA: Diagnosis not present

## 2022-05-23 LAB — LIPID PANEL
Cholesterol: 113 mg/dL (ref 0–200)
HDL: 18 mg/dL — ABNORMAL LOW (ref >40)
LDL Cholesterol: 67 mg/dL (ref 0–99)
Total CHOL/HDL Ratio: 6.3 RATIO
Triglycerides: 139 mg/dL (ref <150)
VLDL: 28 mg/dL (ref 0–40)

## 2022-05-23 LAB — GLUCOSE, CAPILLARY: Glucose-Capillary: 239 mg/dL — ABNORMAL HIGH (ref 70–99)

## 2022-05-23 LAB — C-PEPTIDE: C-Peptide: 3.4 ng/mL (ref 1.1–4.4)

## 2022-05-23 MED ORDER — AMOXICILLIN-POT CLAVULANATE 875-125 MG PO TABS: 1.0000 | ORAL_TABLET | Freq: Two times a day (BID) | ORAL | 0 refills | Status: AC

## 2022-05-23 MED ORDER — LANCETS MISC: 1.0000 | Freq: Three times a day (TID) | 0 refills | Status: AC

## 2022-05-23 MED ORDER — BLOOD GLUCOSE TEST VI STRP: 1.0000 | ORAL_STRIP | Freq: Three times a day (TID) | 0 refills | Status: AC

## 2022-05-23 MED ORDER — LANCET DEVICE MISC: 1.0000 | Freq: Three times a day (TID) | 0 refills | Status: AC

## 2022-05-23 MED ORDER — AMLODIPINE BESYLATE 5 MG PO TABS: 5.0000 mg | ORAL_TABLET | Freq: Every day | ORAL | 11 refills | Status: AC

## 2022-05-23 MED ORDER — PEN NEEDLES 31G X 5 MM MISC: 1.0000 | Freq: Three times a day (TID) | 0 refills | Status: AC

## 2022-05-23 MED ORDER — ACYCLOVIR 5 % EX OINT: TOPICAL_OINTMENT | Freq: Four times a day (QID) | CUTANEOUS | 0 refills | Status: AC

## 2022-05-23 MED ORDER — INSULIN ASPART 100 UNIT/ML FLEXPEN: 0.0000 [IU] | PEN_INJECTOR | Freq: Three times a day (TID) | SUBCUTANEOUS | 0 refills | Status: AC

## 2022-05-23 MED ORDER — INSULIN GLARGINE 100 UNIT/ML SOLOSTAR PEN: 25.0000 [IU] | PEN_INJECTOR | Freq: Every day | SUBCUTANEOUS | 0 refills | Status: AC

## 2022-05-23 MED ORDER — GUAIFENESIN ER 600 MG PO TB12: 1200.0000 mg | ORAL_TABLET | Freq: Two times a day (BID) | ORAL | 0 refills | Status: AC

## 2022-05-23 MED ORDER — GLIPIZIDE 2.5 MG PO TABS: 2.5000 mg | ORAL_TABLET | Freq: Two times a day (BID) | ORAL | 2 refills | Status: AC

## 2022-05-23 MED ORDER — INSULIN GLARGINE-YFGN 100 UNIT/ML ~~LOC~~ SOLN
30.0000 [IU] | Freq: Every day | SUBCUTANEOUS | Status: DC
  Administered 2022-05-23: 30 [IU] via SUBCUTANEOUS
  Filled 2022-05-23 (×2): qty 0.3

## 2022-05-23 MED ORDER — LOSARTAN POTASSIUM 100 MG PO TABS: 100.0000 mg | ORAL_TABLET | Freq: Every day | ORAL | 2 refills | Status: AC

## 2022-05-23 MED ORDER — METFORMIN HCL 500 MG PO TABS: 500.0000 mg | ORAL_TABLET | Freq: Two times a day (BID) | ORAL | 2 refills | Status: AC

## 2022-05-23 MED ORDER — BLOOD GLUCOSE MONITORING SUPPL DEVI: 1.0000 | Freq: Three times a day (TID) | 0 refills | Status: AC

## 2022-05-23 NOTE — Discharge Summary (Signed)
Physician Discharge Summary  Walter Greer [id number] DOB: 1990-04-18 DOA: 05/21/2022  PCP: [facility]  Admit date: 05/21/2022 Discharge date: 05/23/2022  Admitted From: Home Discharge disposition: Home  Recommendations at discharge:  5 more days of oral Augmentin Given he started on insulin and antidiabetic pills.  Continue to monitor blood sugar at home on a regular basis and follow-up with their primary care provider for further adjustment. Strongly advised weight loss, diet control and lifestyle changes    Brief narrative: Walter Greer is a 32 y.o. male with PMH significant for morbid obesity, HTN 3/7, patient presented to the ED from home with complaint of fever, cough, shortness of breath, runny nose and intermittent fever.  He tried with over-the-counter ibuprofen but symptoms progressed and hence came to the ED  In the ED, patient had a fever of 101.1, tachycardia to 130, blood pressure elevated to 190/110. O2 sat 88% on room air and required 3 L oxygen by nasal cannula. Chest x-ray patchy infiltrates on both perihilar region suspicious for pneumonia. Labs showed WC count elevated to 11.4, hemoglobin 12.5, lactic acid 1.8, sodium 134, potassium 3.4, glucose elevated to 436, anion gap elevated to 16. Respiratory virus panel positive for Influenza B Blood culture sent. Patient was given IV ceftriaxone and azithromycin  Admitted to [facility]   Subjective: Patient was seen and examined this morning.   Propped up in bed.  Not in distress.  Taking his breakfast.  Family at bedside.  Not on oxygen.  Did not need oxygen last night or ambulation yesterday afternoon.   Feels ready to go home today.    Hospital course: Sepsis secondary to pneumonia Acute respiratory failure with hypoxia Flu B positive Presented with fever, tachycardia Chest x-ray with pneumonia Also positive for flu B but with symptoms ongoing for 7 days, no role of antiviral at this time. Currently  improving IV Rocephin and azithromycin. Discharge on 5 more days of oral Augmentin. Continue Mucinex, incentive spirometry, flutter valve. Initially required 3 L oxygen by nasal cannula.  Gradually weaned down.  Able to ambulate without supplemental oxygen yesterday. Recent Labs  Lab 05/21/22 1155 05/22/22 0345  WBC 11.4* 13.1*  LATICACIDVEN 1.8  --     New onset type 2 diabetes mellitus uncontrolled with hyperglycemia Presented with significant elevated blood glucose level.  Newly diagnosed diabetes with A1c 11.1 on 05/21/2022. He was started on basal bolus regimen.   Diabetes care coordinator was consulted.   Extensively discussed the need of diabetes control, weight loss, lifestyle changes and adherence to insulin. Plan to discharge on Lantus 25 units daily, sensitive sliding scale, glipizide 2.5 mg twice daily and metformin 500 mg twice daily Prescription given for insulin, oral meds as well as glucometer and supplies. Need to monitor blood sugar at home and follow-up with PCP as an outpatient for further adjustment.  Hypokalemia Potassium level was low.  Replacement given. Recent Labs  Lab 05/21/22 1155 05/22/22 0345  K 3.4* 3.3*   Essential hypertension Blood pressure elevated over 190s on presentation.  He was not on blood pressure medicines at home.   Given concomitant diabetes, started on losartan 100 mg daily.  Blood pressure still runs elevated over 140/90.  I would add amlodipine 5 mg daily as well. Discharged on the same.  Dyslipidemia LDL normal at 67 but significantly low HDL at 18.  Suggest lifestyle management with diet control exercise. 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 Patient has been advised to make an attempt to improve diet and exercise patterns to aid in weight loss.    Mobility: Encourage ambulation  Goals of  care   Code Status: Full Code   Wounds:  -    Discharge Exam:   Vitals:   05/23/22 0600 05/23/22 0757 05/23/22 0832 05/23/22 0833  BP: (!) 140/99 (!) 145/100    Pulse: (!) 112 (!) 104    Resp: 17 18    Temp: 99 F (37.2 C) 98.6 F (37 C)    TempSrc: Oral Oral    SpO2: 95% 94% 95% 95%    There is no height or weight on file to calculate BMI.   General exam: Pleasant, young obese African-American male Skin: No rashes, lesions or ulcers. HEENT: Atraumatic, normocephalic, no obvious bleeding Lungs: No crackles or wheezing, clear to auscultation bilaterally CVS: Regular rate and rhythm, no murmur GI/Abd soft, nontender, non distended, bowel sound present CNS: Alert, awake, oriented x 3 Psychiatry: Mood appropriate Extremities: No pedal edema, no calf tenderness  Follow ups:    Follow-up Information     [facility]   Contact information: [place] [address] [phone]                 Discharge Instructions:   Discharge Instructions     Call MD for:  difficulty breathing, headache or visual disturbances   Complete by: As directed    Call MD for:  extreme fatigue   Complete by: As directed    Call MD for:  hives   Complete by: As directed    Call MD for:  persistant dizziness or light-headedness   Complete by: As directed    Call MD for:  persistant nausea and vomiting   Complete by: As directed    Call MD for:  severe uncontrolled pain   Complete by: As directed    Call MD for:  temperature >100.4   Complete by: As directed    Diet - low sodium heart healthy   Complete by: As directed    Diet Carb Modified   Complete by: As directed    Diet general   Complete by: As directed    Discharge instructions   Complete by: As directed    Recommendations at discharge:   5 more days of oral Augmentin  Given he started on insulin and antidiabetic pills.  Continue to monitor blood sugar at home on a regular  basis and follow-up with their primary care provider for further adjustment.  Strongly advised weight loss, diet control and lifestyle changes  Discharge instructions for diabetes mellitus: Check blood sugar 3 times a day and bedtime at home. If blood sugar running above 200 or less than 70 please call your MD to adjust insulin. If you notice signs and symptoms of hypoglycemia (low blood sugar) like jitteriness, confusion, thirst, tremor and sweating, please check blood sugar, drink sugary drink/biscuits/sweets to increase sugar level and call MD or return to ER.    General discharge instructions: Follow with Primary MD [facility] in 7 days  Please request your PCP  to go over your hospital tests, procedures, radiology results at the follow up. Please get your medicines reviewed and adjusted.  Your PCP may decide to repeat certain labs or tests as needed. Do not drive, operate heavy machinery, perform activities  at heights, swimming or participation in water activities or provide baby sitting services if your were admitted for syncope or siezures until you have seen by Primary MD or a Neurologist and advised to do so again. [place] Controlled Substance Reporting System database was reviewed. Do not drive, operate heavy machinery, perform activities at heights, swim, participate in water activities or provide baby-sitting services while on medications for pain, sleep and mood until your outpatient physician has reevaluated you and advised to do so again.  You are strongly recommended to comply with the dose, frequency and duration of prescribed medications. Activity: As tolerated with Full fall precautions use walker/cane & assistance as needed Avoid using any recreational substances like cigarette, tobacco, alcohol, or non-prescribed drug. If you experience worsening of your admission symptoms, develop shortness of breath, life threatening emergency, suicidal or homicidal  thoughts you must seek medical attention immediately by calling 911 or calling your MD immediately  if symptoms less severe. You must read complete instructions/literature along with all the possible adverse reactions/side effects for all the medicines you take and that have been prescribed to you. Take any new medicine only after you have completely understood and accepted all the possible adverse reactions/side effects.  Wear Seat belts while driving. You were cared for by a hospitalist during your hospital stay. If you have any questions about your discharge medications or the care you received while you were in the hospital after you are discharged, you can call the unit and ask to speak with the hospitalist or the covering physician. Once you are discharged, your primary care physician will handle any further medical issues. Please note that NO REFILLS for any discharge medications will be authorized once you are discharged, as it is imperative that you return to your primary care physician (or establish a relationship with a primary care physician if you do not have one).   Increase activity slowly   Complete by: As directed        Discharge Medications:   Allergies as of 05/23/2022   No Known Allergies      Medication List     TAKE these medications    acyclovir ointment 5 % Commonly known as: ZOVIRAX Apply topically every 6 (six) hours for 5 days.   amLODipine 5 MG tablet Commonly known as: NORVASC Take 1 tablet (5 mg total) by mouth daily.   amoxicillin-clavulanate 875-125 MG tablet Commonly known as: AUGMENTIN Take 1 tablet by mouth 2 (two) times daily for 5 days.   Blood Glucose Monitoring Suppl Devi 1 each by Does not apply route 3 (three) times daily. May dispense any manufacturer covered by patient's insurance.   BLOOD GLUCOSE TEST STRIPS Strp 1 each by Does not apply route 3 (three) times daily. Use as directed to check blood sugar. May dispense any manufacturer  covered by patient's insurance and fits patient's device.   glipiZIDE 2.5 MG Tabs Take 2.5 mg by mouth 2 (two) times daily before a meal.   guaiFENesin 600 MG 12 hr tablet Commonly known as: MUCINEX Take 2 tablets (1,200 mg total) by mouth 2 (two) times daily for 7 days.   insulin aspart 100 UNIT/ML FlexPen Commonly known as: NOVOLOG Inject 0-6 Units into the skin 3 (three) times daily with meals. Check sugar and inject per scale: BG <150= 0 unit; BG 150-200= 1 unit; BG 201-250= 2 unit; BG 251-300= 3 unit; BG 301-350= 4 unit; BG 351-400= 5 unit; BG >400= 6 unit and Call Provider.  insulin glargine 100 UNIT/ML Solostar Pen Commonly known as: LANTUS Inject 25 Units into the skin daily. May substitute as needed per insurance.   Lancet Device Misc 1 each by Does not apply route 3 (three) times daily. May dispense any manufacturer covered by patient's insurance.   Lancets Misc 1 each by Does not apply route 3 (three) times daily. Use as directed to check blood sugar. May dispense any manufacturer covered by patient's insurance and fits patient's device.   losartan 100 MG tablet Commonly known as: COZAAR Take 1 tablet (100 mg total) by mouth daily. Start taking on: May 24, 2022   metFORMIN 500 MG tablet Commonly known as: GLUCOPHAGE Take 1 tablet (500 mg total) by mouth 2 (two) times daily with a meal.   Pen Needles 31G X 5 MM Misc 1 each by Does not apply route 3 (three) times daily. May dispense any manufacturer covered by patient's insurance.         The results of significant diagnostics from this hospitalization (including imaging, microbiology, ancillary and laboratory) are listed below for reference.    Procedures and Diagnostic Studies:   DG Chest 2 View  Result Date: 05/21/2022 CLINICAL DATA:  Shortness of breath.  Cough. EXAM: CHEST - 2 VIEW COMPARISON:  Chest radiographs 04/26/2009 FINDINGS: Cardiac silhouette and mediastinal contours are within normal limits.  There mild patchy airspace opacities within left perihilar region and the right infrahilar region. Mild central bronchial wall thickening. No pleural effusion pneumothorax. No acute skeletal abnormality. IMPRESSION: 1. Mild patchy airspace opacities within the left perihilar region and the right infrahilar region, suspicious for pneumonia. 2. Mild central bronchial wall thickening, which can be seen with bronchitis. Electronically Signed   By: Yvonne Kendall M.D.   On: 05/21/2022 11:50     Labs:   Basic Metabolic Panel: Recent Labs  Lab 05/21/22 1155 05/22/22 0345  NA 134* 136  K 3.4* 3.3*  CL 95* 98  CO2 23 24  GLUCOSE 436* 304*  BUN 13 8  CREATININE 1.11 0.80  CALCIUM 8.8* 8.4*   GFR CrCl cannot be calculated (Unknown ideal weight.). Liver Function Tests: Recent Labs  Lab 05/21/22 1155  AST 38  ALT 49*  ALKPHOS 60  BILITOT 1.0  PROT 7.6  ALBUMIN 2.4*   No results for input(s): "LIPASE", "AMYLASE" in the last 168 hours. No results for input(s): "AMMONIA" in the last 168 hours. Coagulation profile No results for input(s): "INR", "PROTIME" in the last 168 hours.  CBC: Recent Labs  Lab 05/21/22 1155 05/22/22 0345  WBC 11.4* 13.1*  NEUTROABS 6.8  --   HGB 12.5* 11.2*  HCT 38.4* 34.7*  MCV 96.5 97.5  PLT 344 342   Cardiac Enzymes: No results for input(s): "CKTOTAL", "CKMB", "CKMBINDEX", "TROPONINI" in the last 168 hours. BNP: Invalid input(s): "POCBNP" CBG: Recent Labs  Lab 05/22/22 0751 05/22/22 1204 05/22/22 1709 05/22/22 2000 05/23/22 0754  GLUCAP 278* 272* 239* 223* 239*   D-Dimer No results for input(s): "DDIMER" in the last 72 hours. Hgb A1c Recent Labs    05/21/22 1414  HGBA1C 11.1*   Lipid Profile Recent Labs    05/23/22 0316  CHOL 113  HDL 18*  LDLCALC 67  TRIG 139  CHOLHDL 6.3   Thyroid function studies No results for input(s): "TSH", "T4TOTAL", "T3FREE", "THYROIDAB" in the last 72 hours.  Invalid input(s): "FREET3" Anemia  work up No results for input(s): "VITAMINB12", "FOLATE", "FERRITIN", "TIBC", "IRON", "RETICCTPCT" in the last 72 hours. Microbiology Recent  Results (from the past 240 hour(s))  Resp panel by RT-PCR (RSV, Flu A&B, Covid) Anterior Nasal Swab     Status: Abnormal   Collection Time: 05/21/22 12:20 PM   Specimen: Anterior Nasal Swab  Result Value Ref Range Status   SARS Coronavirus 2 by RT PCR NEGATIVE NEGATIVE Final   Influenza A by PCR NEGATIVE NEGATIVE Final   Influenza B by PCR POSITIVE (A) NEGATIVE Final    Comment: (NOTE) The Xpert Xpress SARS-CoV-2/FLU/RSV plus assay is intended as an aid in the diagnosis of influenza from Nasopharyngeal swab specimens and should not be used as a sole basis for treatment. Nasal washings and aspirates are unacceptable for Xpert Xpress SARS-CoV-2/FLU/RSV testing.  Fact Sheet for Patients: EntrepreneurPulse.com.au  Fact Sheet for Healthcare Providers: IncredibleEmployment.be  This test is not yet approved or cleared by the [place] FDA and has been authorized for detection and/or diagnosis of SARS-CoV-2 by FDA under an Emergency Use Authorization (EUA). This EUA will remain in effect (meaning this test can be used) for the duration of the COVID-19 declaration under Section 564(b)(1) of the Act, 21 U.S.C. section 360bbb-3(b)(1), unless the authorization is terminated or revoked.     Resp Syncytial Virus by PCR NEGATIVE NEGATIVE Final    Comment: (NOTE) Fact Sheet for Patients: EntrepreneurPulse.com.au  Fact Sheet for Healthcare Providers: IncredibleEmployment.be  This test is not yet approved or cleared by the [place] FDA and has been authorized for detection and/or diagnosis of SARS-CoV-2 by FDA under an Emergency Use Authorization (EUA). This EUA will remain in effect (meaning this test can be used) for the duration of the COVID-19 declaration under Section  564(b)(1) of the Act, 21 U.S.C. section 360bbb-3(b)(1), unless the authorization is terminated or revoked.  Performed at [facility], [place] [address] [place], [place] [address]   Culture, blood (routine x 2)     Status: None (Preliminary result)   Collection Time: 05/21/22 12:45 PM   Specimen: BLOOD  Result Value Ref Range Status   Specimen Description BLOOD SITE NOT SPECIFIED  Final   Special Requests   Final    BOTTLES DRAWN AEROBIC AND ANAEROBIC Blood Culture adequate volume   Culture   Final    NO GROWTH 2 DAYS Performed at [facility], [address], [place], [place] [address]    Report Status PENDING  Incomplete  Culture, blood (routine x 2)     Status: None (Preliminary result)   Collection Time: 05/21/22 12:50 PM   Specimen: BLOOD LEFT HAND  Result Value Ref Range Status   Specimen Description BLOOD LEFT HAND  Final   Special Requests   Final    BOTTLES DRAWN AEROBIC AND ANAEROBIC Blood Culture results may not be optimal due to an inadequate volume of blood received in culture bottles   Culture   Final    NO GROWTH 2 DAYS Performed at [facility], [place] [address] [place], [place] [address]    Report Status PENDING  Incomplete  Expectorated Sputum Assessment w Gram Stain, Rflx to Resp Cult     Status: None   Collection Time: 05/21/22  2:00 PM   Specimen: Expectorated Sputum  Result Value Ref Range Status   Specimen Description EXPECTORATED SPUTUM  Final   Special Requests NONE  Final   Sputum evaluation   Final    THIS SPECIMEN IS ACCEPTABLE FOR SPUTUM CULTURE Performed at [facility], [place] [address] [place], [place] [address]  Report Status 05/22/2022 FINAL  Final  Culture, Respiratory w Gram Stain     Status: None (Preliminary result)   Collection Time: 05/21/22  2:00 PM  Result Value Ref Range Status   Specimen Description EXPECTORATED SPUTUM  Final   Special Requests NONE Reflexed from [id number]  Final   Gram Stain   Final     MODERATE WBC PRESENT,BOTH PMN AND MONONUCLEAR FEW GRAM POSITIVE COCCI RARE GRAM NEGATIVE RODS RARE GRAM POSITIVE RODS Performed at [facility], [place] [address], [place], [place] [address]    Culture PENDING  Incomplete   Report Status PENDING  Incomplete    Time coordinating discharge: 35 minutes  Signed: Corinne Goucher  [facility] 05/23/2022, 11:14 AM

## 2022-05-23 NOTE — Inpatient Diabetes Management (Signed)
Inpatient Diabetes Program Recommendations  AACE/ADA: New Consensus Statement on Inpatient Glycemic Control (2015)  Target Ranges:  Prepandial:   less than 140 mg/dL      Peak postprandial:   less than 180 mg/dL (1-2 hours)      Critically ill patients:  140 - 180 mg/dL   Lab Results  Component Value Date   GLUCAP 239 (H) 05/23/2022   HGBA1C 11.1 (H) 05/21/2022    Review of Glycemic Control  Diabetes history: New-onset DM Outpatient Diabetes medications: None Current orders for Inpatient glycemic control: Semglee 30 QD, Novolog 0-20 TID with meals and 0-5 HS  HgbA1C - 11.1% CBGs 3/8: 278, 272, 239, 223 Needs meal coverage insulin  Inpatient Diabetes Program Recommendations:    Consider adding Novolog 8 units TID with meals if eating > 50%  Spoke with patient about new diabetes diagnosis.  Discussed A1C results (11.1%) and explained what an A1C is and informed patient that his current A1C indicates an average glucose of 272 mg/dl over the past 2-3 months. Discussed basic pathophysiology of DM Type 2, basic home care, importance of checking CBGs and maintaining good CBG control to prevent long-term and short-term complications. Reviewed glucose and A1C goals. Reviewed signs and symptoms of hyperglycemia and hypoglycemia along with treatment for both. Discussed impact of nutrition, exercise, stress, sickness, and medications on diabetes control. Reviewed Living Well with diabetes booklet and encouraged patient to read through entire book. Discussed differences in basal vs bolus insulin. Pt states he has family members with diabetes and they had taken pills instead of insulin. Long discussion regarding weight loss, exercise and lifestyle modification to control blood sugars. Pt states he was normal weight through high school, but just "got lazy" over the past few years and gained significant amount of weight.  ducated patient on insulin pen use at home. Reviewed contents of insulin flexpen  starter kit. Reviewed all steps if insulin pen including attachment of needle, 2-unit air shot, dialing up dose, giving injection, removing needle, disposal of sharps, storage of unused insulin, disposal of insulin etc. Patient able to provide successful return demonstration. Also reviewed troubleshooting with insulin pen. MD to give patient Rxs for insulin pens and insulin pen needles. Will likely need both basal insulin and meal coverage/correction insulin to control blood sugars at home. Pt willing to do what is needed to control his blood sugars and lower risks of long and short-term complications.  Pt to f/u with PCP after discharge and take logbook of blood sugars for review. Answered all questions/concerns.   Continue to follow.  Thank you. Lorenda Peck, RD, LDN, [facility] Inpatient Diabetes Coordinator [phone]

## 2022-05-23 NOTE — Plan of Care (Signed)
Discharged

## 2022-05-24 LAB — CULTURE, RESPIRATORY W GRAM STAIN

## 2022-05-26 LAB — CULTURE, BLOOD (ROUTINE X 2)
Culture: NO GROWTH
Culture: NO GROWTH
Special Requests: ADEQUATE

## 2022-06-01 LAB — PROINSULIN/INSULIN RATIO
Insulin: 12 u[IU]/mL
Proinsulin: 6.3 pmol/L

## 2022-10-31 ENCOUNTER — Ambulatory Visit (HOSPITAL_COMMUNITY): Payer: Medicaid Other

## 2022-10-31 ENCOUNTER — Encounter (HOSPITAL_COMMUNITY): Payer: Self-pay | Admitting: Emergency Medicine

## 2022-10-31 ENCOUNTER — Ambulatory Visit (HOSPITAL_COMMUNITY)
Admission: EM | Admit: 2022-10-31 | Discharge: 2022-10-31 | Disposition: A | Discharge status: Home / Self Care | Payer: Medicaid Other | Attending: Emergency Medicine | Admitting: Emergency Medicine

## 2022-10-31 ENCOUNTER — Telehealth (HOSPITAL_COMMUNITY): Payer: Self-pay

## 2022-10-31 ENCOUNTER — Other Ambulatory Visit: Payer: Self-pay

## 2022-10-31 DIAGNOSIS — R062 Wheezing: Secondary | ICD-10-CM | POA: Diagnosis present

## 2022-10-31 DIAGNOSIS — J069 Acute upper respiratory infection, unspecified: Secondary | ICD-10-CM | POA: Insufficient documentation

## 2022-10-31 DIAGNOSIS — Z1152 Encounter for screening for COVID-19: Secondary | ICD-10-CM | POA: Insufficient documentation

## 2022-10-31 HISTORY — DX: Type 2 diabetes mellitus without complications: E11.9

## 2022-10-31 MED ORDER — PREDNISONE 20 MG PO TABS: 40.0000 mg | ORAL_TABLET | Freq: Every day | ORAL | 0 refills | Status: AC

## 2022-10-31 MED ORDER — VENTOLIN HFA 108 (90 BASE) MCG/ACT IN AERS: 1.0000 | INHALATION_SPRAY | RESPIRATORY_TRACT | 0 refills | Status: AC | PRN

## 2022-10-31 NOTE — Telephone Encounter (Signed)
Informed by the front desk staff that Patient called in with questions regarding their prescriptions.

## 2022-10-31 NOTE — ED Provider Notes (Signed)
[facility]    CSN: [id number] Arrival date & time: 10/31/22  1006      History   Chief Complaint Chief Complaint  Patient presents with   Nasal Congestion         HPI Walter Greer is a 32 y.o. male.   Patient presents to clinic for nasal congestion, mucus, expiratory wheezing and subjective fever.  Symptoms started yesterday.  He did not measure his temperature at home and has not taken any over-the-counter medications.  He has no history of asthma, and he does not smoke.  He does have a history of pneumonia and admission back in March.   He is a type II diabetic, reports that his blood sugars are usually in the 80s in the mornings prior to eating.  He has not taken any of his daily medications yet, reports overall compliance.   Denies recent known sick contacts.   The history is provided by the patient and medical records.    Past Medical History:  Diagnosis Date   Diabetes mellitus without complication (HCC)    Hypertension     Patient Active Problem List   Diagnosis Date Noted   CAP (community acquired pneumonia) 05/21/2022   Strep pharyngitis 05/21/2022   Diabetes (HCC) 05/21/2022   Essential hypertension 05/21/2022   PNA (pneumonia) 05/21/2022    History reviewed. No pertinent surgical history.     Home Medications    Prior to Admission medications   Medication Sig Start Date End Date Taking? Authorizing Provider  albuterol (VENTOLIN HFA) 108 (90 Base) MCG/ACT inhaler Inhale 1-2 puffs into the lungs every 4 (four) hours as needed for wheezing or shortness of breath. 10/31/22  Yes Rinaldo Ratel, [place] N, FNP  predniSONE (DELTASONE) 20 MG tablet Take 2 tablets (40 mg total) by mouth daily with breakfast for 5 days. 10/31/22 11/05/22 Yes Rinaldo Ratel, [place], FNP  amLODipine (NORVASC) 5 MG tablet Take 1 tablet (5 mg total) by mouth daily. 05/23/22 05/23/23  Lorin Glass, MD  Blood Glucose Monitoring Suppl DEVI 1 each by Does not apply route 3 (three)  times daily. May dispense any manufacturer covered by patient's insurance. 05/23/22   Lorin Glass, MD  glipiZIDE 2.5 MG TABS Take 2.5 mg by mouth 2 (two) times daily before a meal. 05/23/22 08/21/22  Dahal, Melina Schools, MD  Glucose Blood (BLOOD GLUCOSE TEST STRIPS) STRP 1 each by Does not apply route 3 (three) times daily. Use as directed to check blood sugar. May dispense any manufacturer covered by patient's insurance and fits patient's device. 05/23/22   Dahal, Melina Schools, MD  insulin aspart (NOVOLOG) 100 UNIT/ML FlexPen Inject 0-6 Units into the skin 3 (three) times daily with meals. Check sugar and inject per scale: BG <150= 0 unit; BG 150-200= 1 unit; BG 201-250= 2 unit; BG 251-300= 3 unit; BG 301-350= 4 unit; BG 351-400= 5 unit; BG >400= 6 unit and Call Provider. 05/23/22   Lorin Glass, MD  insulin glargine (LANTUS) 100 UNIT/ML Solostar Pen Inject 25 Units into the skin daily. May substitute as needed per insurance. 05/23/22   Dahal, Melina Schools, MD  Insulin Pen Needle (PEN NEEDLES) 31G X 5 MM MISC 1 each by Does not apply route 3 (three) times daily. May dispense any manufacturer covered by patient's insurance. 05/23/22   Lorin Glass, MD  Lancet Device MISC 1 each by Does not apply route 3 (three) times daily. May dispense any manufacturer covered by patient's insurance. 05/23/22   Lorin Glass, MD  Lancets MISC  1 each by Does not apply route 3 (three) times daily. Use as directed to check blood sugar. May dispense any manufacturer covered by patient's insurance and fits patient's device. 05/23/22   Lorin Glass, MD  losartan (COZAAR) 100 MG tablet Take 1 tablet (100 mg total) by mouth daily. 05/24/22 08/22/22  Lorin Glass, MD  metFORMIN (GLUCOPHAGE) 500 MG tablet Take 1 tablet (500 mg total) by mouth 2 (two) times daily with a meal. 05/23/22 08/21/22  Lorin Glass, MD    Family History Family History  Problem Relation Age of Onset   Heart failure Father     Social History Social History   Tobacco Use   Smoking  status: Former   Smokeless tobacco: Never  Vaping Use   Vaping status: Never Used  Substance Use Topics   Alcohol use: No   Drug use: Never     Allergies   Patient has no known allergies.   Review of Systems Review of Systems  Constitutional:  Positive for fever.  HENT:  Positive for congestion and postnasal drip.   Respiratory:  Positive for cough and wheezing. Negative for shortness of breath.   Cardiovascular:  Negative for chest pain.  Gastrointestinal:  Negative for abdominal pain, diarrhea, nausea and vomiting.     Physical Exam Triage Vital Signs ED Triage Vitals  Encounter Vitals Group     BP 10/31/22 1035 (!) 157/106     Systolic BP Percentile --      Diastolic BP Percentile --      Pulse Rate 10/31/22 1035 (!) 107     Resp 10/31/22 1035 20     Temp 10/31/22 1035 99 F (37.2 C)     Temp Source 10/31/22 1035 Oral     SpO2 10/31/22 1035 96 %     Weight 10/31/22 1039 300 lb (136.1 kg)     Height 10/31/22 1039 5\' 9"  (1.753 m)     Head Circumference --      Peak Flow --      Pain Score 10/31/22 1039 0     Pain Loc --      Pain Education --      Exclude from Growth Chart --    No data found.  Updated Vital Signs BP (!) 157/106   Pulse (!) 107   Temp 99 F (37.2 C) (Oral)   Resp 20   Ht 5\' 9"  (1.753 m)   Wt 300 lb (136.1 kg)   SpO2 96%   BMI 44.30 kg/m   Visual Acuity Right Eye Distance:   Left Eye Distance:   Bilateral Distance:    Right Eye Near:   Left Eye Near:    Bilateral Near:     Physical Exam Vitals and nursing note reviewed.  Constitutional:      Appearance: Normal appearance. He is obese.  HENT:     Head: Normocephalic and atraumatic.     Right Ear: External ear normal.     Left Ear: External ear normal.     Nose: Nose normal.     Mouth/Throat:     Mouth: Mucous membranes are moist.  Eyes:     Conjunctiva/sclera: Conjunctivae normal.  Cardiovascular:     Rate and Rhythm: Regular rhythm. Tachycardia present.     Heart  sounds: Normal heart sounds. No murmur heard. Pulmonary:     Effort: Pulmonary effort is normal.     Breath sounds: Wheezing and rhonchi present.  Musculoskeletal:  General: Normal range of motion.  Skin:    General: Skin is warm and dry.  Neurological:     General: No focal deficit present.     Mental Status: He is alert and oriented to person, place, and time.  Psychiatric:        Mood and Affect: Mood normal.        Behavior: Behavior normal.      UC Treatments / Results  Labs (all labs ordered are listed, but only abnormal results are displayed) Labs Reviewed  SARS CORONAVIRUS 2 (TAT 6-24 HRS)    EKG   Radiology DG Chest 2 View  Result Date: 10/31/2022 CLINICAL DATA:  Wheezing, cough EXAM: CHEST - 2 VIEW COMPARISON:  05/21/2022 FINDINGS: The heart size and mediastinal contours are within normal limits. Both lungs are clear. The visualized skeletal structures are unremarkable. IMPRESSION: No active cardiopulmonary disease. Electronically Signed   By: Elige Ko M.D.   On: 10/31/2022 11:58    Procedures Procedures (including critical care time)  Medications Ordered in UC Medications - No data to display  Initial Impression / Assessment and Plan / UC Course  I have reviewed the triage vital signs and the nursing notes.  Pertinent labs & imaging results that were available during my care of the patient were reviewed by me and considered in my medical decision making (see chart for details).  Vitals and triage reviewed, patient is hemodynamically stable.  Patient with audible expiratory wheezing, rhonchi on inspiration and expiration on auscultation.  Chest x-ray negative for acute infiltrate or changes.  Suspect viral URI. Albuterol inhaler and steroid burst, advised that this will increase blood glucose levels, which have been running about 80 in the mornings.  COVID-19 testing obtained, symptomatic management, plan of care and follow-up care discussed, no  questions at this time.     Final Clinical Impressions(s) / UC Diagnoses   Final diagnoses:  Viral upper respiratory tract infection  Wheezing     Discharge Instructions      We have tested you for COVID-19 and we will contact you if the results are positive.  Please start the steroids daily with breakfast, these will temporarily raise your blood sugar and appetite.  You can use the albuterol inhaler every 4 hours as needed for wheezing or shortness of breath.  You can alternate between 800 mg of ibuprofen and 500 mg of Tylenol for any fever, body aches and chills.  For nasal congestion you can do 1200 mg of Mucinex daily, in addition to at least 64 ounces of water to help loosen your secretions.  You can also consider sleeping with a humidifier.  Return to clinic or seek immediate care if you develop worsening shortness of breath, wheezing, chest pain, fatigue, or no improvement over the next week despite these interventions.      ED Prescriptions     Medication Sig Dispense Auth. Provider   predniSONE (DELTASONE) 20 MG tablet Take 2 tablets (40 mg total) by mouth daily with breakfast for 5 days. 10 tablet Rinaldo Ratel, [place] N, FNP   albuterol (VENTOLIN HFA) 108 (90 Base) MCG/ACT inhaler Inhale 1-2 puffs into the lungs every 4 (four) hours as needed for wheezing or shortness of breath. 18 g Aceton Kinnear, [place] N, [place]      PDMP not reviewed this encounter.   Morgane Joerger, [place] N, [place] 10/31/22 1209

## 2022-10-31 NOTE — Discharge Instructions (Addendum)
We have tested you for COVID-19 and we will contact you if the results are positive.  Please start the steroids daily with breakfast, these will temporarily raise your blood sugar and appetite.  You can use the albuterol inhaler every 4 hours as needed for wheezing or shortness of breath.  You can alternate between 800 mg of ibuprofen and 500 mg of Tylenol for any fever, body aches and chills.  For nasal congestion you can do 1200 mg of Mucinex daily, in addition to at least 64 ounces of water to help loosen your secretions.  You can also consider sleeping with a humidifier.  Return to clinic or seek immediate care if you develop worsening shortness of breath, wheezing, chest pain, fatigue, or no improvement over the next week despite these interventions.

## 2022-10-31 NOTE — Telephone Encounter (Signed)
Called and spoke with Patient and he wanted to known if it was okay to take his first doses of the prednisone now. Patient informed that he could take them now and then start taking them I the mornings tomorrow.   Patient verbalized understanding

## 2022-10-31 NOTE — ED Triage Notes (Signed)
Pt started with congestion and "mucous" yesterday and then felt "feverish" today.  Did not take temp at home or any OTC meds.  Reports had PNA last time felt like this so did not want to wait.  No cough.  Pt reports his bp is always like this when he has not taken his meds.  Unlabored.

## 2022-11-01 LAB — SARS CORONAVIRUS 2 (TAT 6-24 HRS): SARS Coronavirus 2: NEGATIVE
